# Patient Record
Sex: Female | Born: 1955 | Race: White | Hispanic: No | Marital: Married | Smoking: Current every day smoker
Health system: Southern US, Community
[De-identification: ages and names within clinical notes are randomized; demographics above are authoritative.]

## PROBLEM LIST (undated history)

## (undated) DIAGNOSIS — F32A Depression, unspecified: Secondary | ICD-10-CM

## (undated) DIAGNOSIS — F329 Major depressive disorder, single episode, unspecified: Secondary | ICD-10-CM

## (undated) DIAGNOSIS — K219 Gastro-esophageal reflux disease without esophagitis: Secondary | ICD-10-CM

## (undated) DIAGNOSIS — K5900 Constipation, unspecified: Secondary | ICD-10-CM

## (undated) DIAGNOSIS — M109 Gout, unspecified: Secondary | ICD-10-CM

## (undated) DIAGNOSIS — M1990 Unspecified osteoarthritis, unspecified site: Secondary | ICD-10-CM

## (undated) DIAGNOSIS — J45909 Unspecified asthma, uncomplicated: Secondary | ICD-10-CM

## (undated) DIAGNOSIS — I10 Essential (primary) hypertension: Secondary | ICD-10-CM

## (undated) HISTORY — DX: Constipation, unspecified: K59.00

## (undated) HISTORY — DX: Essential (primary) hypertension: I10

## (undated) HISTORY — DX: Major depressive disorder, single episode, unspecified: F32.9

## (undated) HISTORY — DX: Depression, unspecified: F32.A

## (undated) HISTORY — DX: Gastro-esophageal reflux disease without esophagitis: K21.9

## (undated) HISTORY — DX: Unspecified osteoarthritis, unspecified site: M19.90

## (undated) HISTORY — DX: Unspecified asthma, uncomplicated: J45.909

## (undated) HISTORY — DX: Gout, unspecified: M10.9

---

## 2005-02-12 HISTORY — PX: ABDOMINAL HYSTERECTOMY: SHX81

## 2010-02-12 HISTORY — PX: FOOT SURGERY: SHX648

## 2012-07-14 ENCOUNTER — Ambulatory Visit: Payer: Self-pay | Admitting: Family Medicine

## 2012-09-20 ENCOUNTER — Emergency Department: Payer: Self-pay | Admitting: Emergency Medicine

## 2012-09-20 LAB — CBC
HGB: 13.8 g/dL (ref 12.0–16.0)
MCHC: 34.5 g/dL (ref 32.0–36.0)
MCV: 91 fL (ref 80–100)
Platelet: 362 10*3/uL (ref 150–440)
RBC: 4.39 10*6/uL (ref 3.80–5.20)
RDW: 13.4 % (ref 11.5–14.5)
WBC: 9.7 10*3/uL (ref 3.6–11.0)

## 2012-09-20 LAB — TROPONIN I: Troponin-I: <0.02 ng/mL

## 2013-01-16 ENCOUNTER — Ambulatory Visit: Payer: Self-pay | Admitting: Family Medicine

## 2013-05-15 ENCOUNTER — Emergency Department: Payer: Self-pay | Admitting: Emergency Medicine

## 2013-05-15 LAB — COMPREHENSIVE METABOLIC PANEL
ANION GAP: 5 — AB (ref 7–16)
AST: 13 U/L — AB (ref 15–37)
Albumin: 3.7 g/dL (ref 3.4–5.0)
Alkaline Phosphatase: 102 U/L
BUN: 15 mg/dL (ref 7–18)
Bilirubin,Total: 0.4 mg/dL (ref 0.2–1.0)
CO2: 30 mmol/L (ref 21–32)
Calcium, Total: 8.6 mg/dL (ref 8.5–10.1)
Chloride: 102 mmol/L (ref 98–107)
Creatinine: 1.21 mg/dL (ref 0.60–1.30)
EGFR (African American): 58 — ABNORMAL LOW
GFR CALC NON AF AMER: 50 — AB
GLUCOSE: 99 mg/dL (ref 65–99)
OSMOLALITY: 275 (ref 275–301)
Potassium: 3.7 mmol/L (ref 3.5–5.1)
SGPT (ALT): 27 U/L (ref 12–78)
SODIUM: 137 mmol/L (ref 136–145)
Total Protein: 7 g/dL (ref 6.4–8.2)

## 2013-05-15 LAB — CBC WITH DIFFERENTIAL/PLATELET
BASOS PCT: 0.9 %
Basophil #: 0.1 10*3/uL (ref 0.0–0.1)
EOS PCT: 1.8 %
Eosinophil #: 0.2 10*3/uL (ref 0.0–0.7)
HCT: 44.7 % (ref 35.0–47.0)
HGB: 14.6 g/dL (ref 12.0–16.0)
Lymphocyte #: 3.7 10*3/uL — ABNORMAL HIGH (ref 1.0–3.6)
Lymphocyte %: 31.4 %
MCH: 30.2 pg (ref 26.0–34.0)
MCHC: 32.7 g/dL (ref 32.0–36.0)
MCV: 92 fL (ref 80–100)
MONO ABS: 0.6 x10 3/mm (ref 0.2–0.9)
Monocyte %: 5.3 %
NEUTROS ABS: 7.1 10*3/uL — AB (ref 1.4–6.5)
Neutrophil %: 60.6 %
Platelet: 413 10*3/uL (ref 150–440)
RBC: 4.85 10*6/uL (ref 3.80–5.20)
RDW: 13.4 % (ref 11.5–14.5)
WBC: 11.7 10*3/uL — ABNORMAL HIGH (ref 3.6–11.0)

## 2013-05-15 LAB — URINALYSIS, COMPLETE
Bacteria: NONE SEEN
Bilirubin,UR: NEGATIVE
Blood: NEGATIVE
GLUCOSE, UR: NEGATIVE mg/dL (ref 0–75)
Ketone: NEGATIVE
Leukocyte Esterase: NEGATIVE
NITRITE: NEGATIVE
Ph: 6 (ref 4.5–8.0)
Protein: NEGATIVE
RBC,UR: 1 /HPF (ref 0–5)
SPECIFIC GRAVITY: 1.057 (ref 1.003–1.030)

## 2013-05-15 LAB — LIPASE, BLOOD: Lipase: 155 U/L (ref 73–393)

## 2013-05-15 LAB — TROPONIN I

## 2014-02-26 ENCOUNTER — Ambulatory Visit: Payer: Self-pay | Admitting: Gastroenterology

## 2014-04-13 ENCOUNTER — Ambulatory Visit: Payer: Self-pay | Admitting: Gastroenterology

## 2014-06-07 LAB — SURGICAL PATHOLOGY

## 2014-07-27 ENCOUNTER — Ambulatory Visit (INDEPENDENT_AMBULATORY_CARE_PROVIDER_SITE_OTHER): Payer: No Typology Code available for payment source | Admitting: Family Medicine

## 2014-07-27 ENCOUNTER — Encounter: Payer: Self-pay | Admitting: Family Medicine

## 2014-07-27 VITALS — BP 110/82 | HR 72 | Temp 98.0°F | Resp 16 | Wt 165.0 lb

## 2014-07-27 DIAGNOSIS — T148 Other injury of unspecified body region: Secondary | ICD-10-CM

## 2014-07-27 DIAGNOSIS — J45909 Unspecified asthma, uncomplicated: Secondary | ICD-10-CM | POA: Insufficient documentation

## 2014-07-27 DIAGNOSIS — I10 Essential (primary) hypertension: Secondary | ICD-10-CM | POA: Insufficient documentation

## 2014-07-27 DIAGNOSIS — J00 Acute nasopharyngitis [common cold]: Secondary | ICD-10-CM | POA: Insufficient documentation

## 2014-07-27 DIAGNOSIS — F419 Anxiety disorder, unspecified: Secondary | ICD-10-CM | POA: Insufficient documentation

## 2014-07-27 DIAGNOSIS — W57XXXA Bitten or stung by nonvenomous insect and other nonvenomous arthropods, initial encounter: Secondary | ICD-10-CM

## 2014-07-27 DIAGNOSIS — E559 Vitamin D deficiency, unspecified: Secondary | ICD-10-CM | POA: Insufficient documentation

## 2014-07-27 DIAGNOSIS — M25559 Pain in unspecified hip: Secondary | ICD-10-CM | POA: Insufficient documentation

## 2014-07-27 DIAGNOSIS — J019 Acute sinusitis, unspecified: Secondary | ICD-10-CM

## 2014-07-27 DIAGNOSIS — K219 Gastro-esophageal reflux disease without esophagitis: Secondary | ICD-10-CM | POA: Insufficient documentation

## 2014-07-27 DIAGNOSIS — Z1239 Encounter for other screening for malignant neoplasm of breast: Secondary | ICD-10-CM | POA: Diagnosis not present

## 2014-07-27 DIAGNOSIS — M109 Gout, unspecified: Secondary | ICD-10-CM | POA: Insufficient documentation

## 2014-07-27 DIAGNOSIS — Z8709 Personal history of other diseases of the respiratory system: Secondary | ICD-10-CM | POA: Insufficient documentation

## 2014-07-27 DIAGNOSIS — Z8719 Personal history of other diseases of the digestive system: Secondary | ICD-10-CM | POA: Insufficient documentation

## 2014-07-27 MED ORDER — DOXYCYCLINE HYCLATE 100 MG PO TABS: 100.0000 mg | ORAL_TABLET | Freq: Two times a day (BID) | ORAL | Status: DC

## 2014-07-27 MED ORDER — FLUTICASONE PROPIONATE 50 MCG/ACT NA SUSP: 2.0000 | Freq: Every day | NASAL | Status: AC

## 2014-07-27 NOTE — Patient Instructions (Signed)
Discussed use of hydrocortisone cream 2-3 x day to insect bite

## 2014-07-27 NOTE — Progress Notes (Signed)
Subjective:     Patient ID: Katie Werner, female   DOB: 11/29/1955, 59 y.o.   MRN: [id number]  HPI  Chief Complaint  Patient presents with  . Sinus Problem    Patient comes in office today with concerns about sinus pain and pressure above her eyes. Patient reports swelling of right lymph node and pain in both ear but mostly in the right.  . Skin Problem    patient is present for skin check, she states that she noticed a bump on the left side of chest that is red itchy.   Describes intermittent clear to purulent sinus drainage. Bump on left upper chest area has been present for four days. States she does spend time outside.   Review of Systems  HENT: Positive for ear pain (in association with sinus pain).        Objective:   Physical Exam  Constitutional: She appears well-developed and well-nourished.  Ears: T.M's intact without inflammation Sinuses: mild tenderness in frontal and maxillary sinuses. Throat: no tonsillar enlargement or exudate Neck: right anterior cervical node is noted Lungs: clear Skin: erythematous papule with no underlying induration, crusting or drainage to left upper chest.     Assessment:     1. Acute sinusitis, recurrence not specified, unspecified location  - doxycycline (VIBRA-TABS) 100 MG tablet; Take 1 tablet (100 mg total) by mouth 2 (two) times daily.  Dispense: 20 tablet; Refill: 0 - fluticasone (FLONASE) 50 MCG/ACT nasal spray; Place 2 sprays into both nostrils daily.  Dispense: 16 g; Refill: 6  2. Insect bite   3. Breast cancer screening  - MM Digital Screening; Future    Plan:   hydrocortisone to insect bite.

## 2014-09-24 ENCOUNTER — Ambulatory Visit (INDEPENDENT_AMBULATORY_CARE_PROVIDER_SITE_OTHER): Payer: No Typology Code available for payment source | Admitting: Family Medicine

## 2014-09-24 ENCOUNTER — Other Ambulatory Visit: Payer: Self-pay

## 2014-09-24 ENCOUNTER — Encounter: Payer: Self-pay | Admitting: Family Medicine

## 2014-09-24 VITALS — BP 122/86 | HR 109 | Temp 98.1°F | Resp 16 | Wt 167.4 lb

## 2014-09-24 DIAGNOSIS — J988 Other specified respiratory disorders: Secondary | ICD-10-CM | POA: Diagnosis not present

## 2014-09-24 DIAGNOSIS — J069 Acute upper respiratory infection, unspecified: Secondary | ICD-10-CM | POA: Diagnosis not present

## 2014-09-24 MED ORDER — ALBUTEROL SULFATE HFA 108 (90 BASE) MCG/ACT IN AERS: INHALATION_SPRAY | RESPIRATORY_TRACT | Status: DC

## 2014-09-24 MED ORDER — HYDROCODONE-HOMATROPINE 5-1.5 MG/5ML PO SYRP
ORAL_SOLUTION | ORAL | Status: DC
Start: 2014-09-24 — End: 2014-10-01

## 2014-09-24 NOTE — Patient Instructions (Signed)
Add Mucinex D and use cough syrup as needed. Use inhaler at least twice daily.

## 2014-09-24 NOTE — Progress Notes (Signed)
Subjective:     Patient ID: Katie Werner, female   DOB: [birth year], 59 y.o.   MRN: [id number]  HPI  Chief Complaint  Patient presents with  . Sore Throat    dPatient comes in office today with concerns of sore throat, congestion and ear ache for the past several days. Patient would also like to address enlarged lymph node on the right side  States her symptoms started two days ago. Reports clear sinus congestion. Has been using Tylenol and Dayquil. Not using Ventolin inhaler at this time. Reports rare smoking.   Review of Systems  Constitutional: Negative for fever and chills.       Objective:   Physical Exam  Constitutional: She appears well-developed and well-nourished. No distress.  Pulmonary/Chest: She has wheezes (inspiratory and expiratory bilateral posterior lung fields.).  Ears: T.M's obscured by cerumen Throat: no tonsillar enlargement or exudate Neck: right anterior cervical node.      Assessment:    1. Upper respiratory infection - HYDROcodone-homatropine (HYCODAN) 5-1.5 MG/5ML syrup; 5 ml 4-6 hours as needed for cough  Dispense: 240 mL; Refill: 0  2. Wheezing-associated respiratory infection - albuterol (PROVENTIL HFA;VENTOLIN HFA) 108 (90 BASE) MCG/ACT inhaler; Two puffs every 4-6 hours for wheezing or shortness of breath  Dispense: 1 Inhaler; Refill: 1    Plan:    Discussed use of Mucinex D

## 2014-09-28 ENCOUNTER — Ambulatory Visit (INDEPENDENT_AMBULATORY_CARE_PROVIDER_SITE_OTHER): Payer: No Typology Code available for payment source | Admitting: Family Medicine

## 2014-09-28 ENCOUNTER — Encounter: Payer: Self-pay | Admitting: Family Medicine

## 2014-09-28 VITALS — BP 110/78 | HR 88 | Temp 97.8°F | Resp 18 | Wt 166.0 lb

## 2014-09-28 DIAGNOSIS — J40 Bronchitis, not specified as acute or chronic: Secondary | ICD-10-CM

## 2014-09-28 DIAGNOSIS — R062 Wheezing: Secondary | ICD-10-CM | POA: Diagnosis not present

## 2014-09-28 MED ORDER — PREDNISONE 20 MG PO TABS: ORAL_TABLET | ORAL | Status: DC

## 2014-09-28 MED ORDER — AZITHROMYCIN 250 MG PO TABS: ORAL_TABLET | ORAL | Status: DC

## 2014-09-28 NOTE — Progress Notes (Signed)
Subjective:     Patient ID: Katie Werner, female   DOB: 03-Oct-1955, 59 y.o.   MRN: [id number]  HPI  Chief Complaint  Patient presents with  . URI    Patient is present in office today for follow up on 09/24/14. Patient reports that symptoms have got worse and she is having tightness in chest and wheezing. Patient reports that she has productive cough and has a hard time catching her breath. Patient reports that she is still taking medication that was prescribed at last office visit but it is not helping.   States she is using albuterol MDI several x day and cough syrup with minimal improvement. Reports continued clear sinus drainage with PND.   Review of Systems  Constitutional: Negative for fever and chills.       Objective:   Physical Exam  Constitutional: She appears well-developed and well-nourished. She has a sickly appearance. No distress.  Ears: T.M's obscured by cerumen Throat: no tonsillar enlargement or exudate Neck: no cervical adenopathy Lungs: inspiratory and expiratory wheezes bilateral posterior lung fields.     Assessment:    1. Wheezing - predniSONE (DELTASONE) 20 MG tablet; Taper as follows: 3 pills for 4 days, two pills for 4 days, one pill for four days  Dispense: 24 tablet; Refill: 0  2. Bronchitis - predniSONE (DELTASONE) 20 MG tablet; Taper as follows: 3 pills for 4 days, two pills for 4 days, one pill for four days  Dispense: 24 tablet; Refill: 0 - azithromycin (ZITHROMAX) 250 MG tablet; 2 pills the first day then one pill daily  Dispense: 6 tablet; Refill: 0    Plan:    Continue albuterol MDI.

## 2014-09-28 NOTE — Patient Instructions (Signed)
Continue to use albuterol every 4-6 hours as needed for shortness of breath or wheezing.

## 2014-10-01 ENCOUNTER — Telehealth: Payer: Self-pay

## 2014-10-01 ENCOUNTER — Other Ambulatory Visit: Payer: Self-pay | Admitting: Family Medicine

## 2014-10-01 DIAGNOSIS — J069 Acute upper respiratory infection, unspecified: Secondary | ICD-10-CM

## 2014-10-01 MED ORDER — HYDROCODONE-HOMATROPINE 5-1.5 MG/5ML PO SYRP: ORAL_SOLUTION | ORAL | Status: DC

## 2014-10-01 NOTE — Telephone Encounter (Signed)
Patient has been advised she states she will have her husband pick up prescription, I informed patient that if symptoms continue to get worse she is strongly urged to seek treatment at ER. Patient understood. KW

## 2014-10-01 NOTE — Telephone Encounter (Signed)
Spoke with patient and her husband and they both state that patient symptoms since last visit have got worse, patient is almost out of Hydrocodone-Homatropine and is requesting that you refill Rx. Patients husband request that you also seen in Advair, he states that Dr. Sullivan Lone had patient on inhaler before and it helped her a lot. Please advise, pharmacy [facility] [address]. [place]

## 2014-10-01 NOTE — Telephone Encounter (Signed)
If she is taking the oral prednisone, antibiotic, and using the albuterol every 4-6 hours and is still short of breath she may need ER visit. Also take into consideration her emotional distress over her mother and this may be contributing to her symptoms as well. A refill on cough syrup will be up front for pickup. Advair is a maintenance medication not for acute wheezing.

## 2014-11-11 ENCOUNTER — Ambulatory Visit (INDEPENDENT_AMBULATORY_CARE_PROVIDER_SITE_OTHER): Payer: No Typology Code available for payment source

## 2014-11-11 DIAGNOSIS — Z23 Encounter for immunization: Secondary | ICD-10-CM | POA: Diagnosis not present

## 2014-11-15 ENCOUNTER — Other Ambulatory Visit: Payer: Self-pay

## 2014-11-15 ENCOUNTER — Telehealth: Payer: Self-pay | Admitting: Family Medicine

## 2014-11-15 MED ORDER — ZOLPIDEM TARTRATE 10 MG PO TABS: 10.0000 mg | ORAL_TABLET | Freq: Every evening | ORAL | Status: DC | PRN

## 2014-11-15 MED ORDER — ALPRAZOLAM 0.5 MG PO TBDP: 0.5000 mg | ORAL_TABLET | Freq: Every evening | ORAL | Status: DC | PRN

## 2014-11-15 MED ORDER — ALPRAZOLAM 0.5 MG PO TABS: 0.5000 mg | ORAL_TABLET | Freq: Every evening | ORAL | Status: DC | PRN

## 2014-11-15 NOTE — Telephone Encounter (Signed)
Try zolpidem 10 mg daily at bedtime when necessary #30 one refill/thanks

## 2014-11-15 NOTE — Telephone Encounter (Signed)
Pt's mother passed and pt is having trouble sleeping would like something to help her.   [facility] [address] CB# [phone] CC

## 2014-11-15 NOTE — Telephone Encounter (Signed)
Left message on v oicemail letting them know RX is ready for pick up up front-aa

## 2014-11-15 NOTE — Telephone Encounter (Signed)
See below please-aa 

## 2014-11-17 ENCOUNTER — Telehealth: Payer: Self-pay | Admitting: Family Medicine

## 2014-11-17 NOTE — Telephone Encounter (Signed)
Pt's husband called stating pt's mother passed away and she is having a hard time.  They contacted insurance and do not need a referral for Dr. Sullivan Lone.  He would like to know who you would recommend for psychiatry.

## 2014-11-17 NOTE — Telephone Encounter (Signed)
Do you know of the name of the place by chance?-aa

## 2014-11-17 NOTE — Telephone Encounter (Signed)
[facility] does.

## 2014-11-17 NOTE — Telephone Encounter (Signed)
Maralyn Sago could you let the patient's husband know of the place thank you-aa

## 2014-11-17 NOTE — Telephone Encounter (Signed)
Please advise. Thanks.  

## 2014-11-17 NOTE — Telephone Encounter (Signed)
[facility] in town. That is the only way she can get in before the end of the year.

## 2015-01-25 ENCOUNTER — Encounter: Payer: Self-pay | Admitting: Family Medicine

## 2015-01-25 ENCOUNTER — Ambulatory Visit (INDEPENDENT_AMBULATORY_CARE_PROVIDER_SITE_OTHER): Payer: No Typology Code available for payment source | Admitting: Family Medicine

## 2015-01-25 VITALS — BP 118/86 | HR 72 | Temp 97.9°F | Resp 16 | Ht 61.25 in | Wt 163.0 lb

## 2015-01-25 DIAGNOSIS — K5909 Other constipation: Secondary | ICD-10-CM

## 2015-01-25 DIAGNOSIS — Z1231 Encounter for screening mammogram for malignant neoplasm of breast: Secondary | ICD-10-CM | POA: Diagnosis not present

## 2015-01-25 DIAGNOSIS — K118 Other diseases of salivary glands: Secondary | ICD-10-CM | POA: Diagnosis not present

## 2015-01-25 DIAGNOSIS — Z124 Encounter for screening for malignant neoplasm of cervix: Secondary | ICD-10-CM | POA: Diagnosis not present

## 2015-01-25 DIAGNOSIS — Z0000 Encounter for general adult medical examination without abnormal findings: Secondary | ICD-10-CM | POA: Diagnosis not present

## 2015-01-25 DIAGNOSIS — R319 Hematuria, unspecified: Secondary | ICD-10-CM | POA: Diagnosis not present

## 2015-01-25 DIAGNOSIS — G4700 Insomnia, unspecified: Secondary | ICD-10-CM

## 2015-01-25 LAB — POCT URINALYSIS DIPSTICK
Bilirubin, UA: NEGATIVE
GLUCOSE UA: NEGATIVE
Ketones, UA: NEGATIVE
Leukocytes, UA: NEGATIVE
Nitrite, UA: NEGATIVE
Protein, UA: NEGATIVE
SPEC GRAV UA: 1.015
UROBILINOGEN UA: 0.2
pH, UA: 6

## 2015-01-25 LAB — POCT UA - MICROSCOPIC ONLY
BACTERIA, U MICROSCOPIC: NEGATIVE
CASTS, UR, LPF, POC: NEGATIVE
CRYSTALS, UR, HPF, POC: NEGATIVE
Epithelial cells, urine per micros: NEGATIVE
MUCUS UA: NEGATIVE
RBC, urine, microscopic: NEGATIVE
Yeast, UA: NEGATIVE

## 2015-01-25 NOTE — Progress Notes (Signed)
Patient ID: Katie Werner, female   DOB: 09/06/1955, 59 y.o.   MRN: [id number]  Visit Date: 01/25/2015  Today's Provider: Megan Mansichard  Jr, MD   Chief Complaint  Patient presents with  . Annual Exam   Subjective:  Katie OhmLisa T Baity is a 59 y.o. female who presents today for health maintenance and complete physical. She feels fairly well. She reports exercising none. She reports she is sleeping fairly well.  Colonoscopy 03/01/14 sessile serrated adenoma, tubular adenoma Sigmoidoscopy 04/13/14 hyperplastic polyps repeat 2 years Pneumovax 01/16/11 Tdap 01/15/13  Review of Systems  Constitutional: Positive for fatigue.  HENT: Positive for ear pain.   Eyes: Positive for redness.  Respiratory: Negative.   Cardiovascular: Negative.   Gastrointestinal: Positive for abdominal pain, diarrhea, constipation and abdominal distention.  Endocrine: Negative.   Genitourinary: Positive for frequency.  Musculoskeletal: Positive for neck pain.  Skin: Negative.   Allergic/Immunologic: Negative.   Neurological: Negative.   Hematological: Positive for adenopathy.  Psychiatric/Behavioral: Negative.     Social History   Social History  . Marital Status: Married    Spouse Name: N/A  . Number of Children: N/A  . Years of Education: N/A   Occupational History  . Not on file.   Social History Main Topics  . Smoking status: Never Smoker   . Smokeless tobacco: Never Used  . Alcohol Use: 0.0 oz/week    0 Standard drinks or equivalent per week  . Drug Use: No  . Sexual Activity: Not Currently   Other Topics Concern  . Not on file   Social History Narrative    Patient Active Problem List   Diagnosis Date Noted  . Anxiety 07/27/2014  . 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 Acute coryza 07/27/2014  . AB (asthmatic bronchitis) 07/27/2014  . Gout 07/27/2014  . Acid reflux 07/27/2014  . Arthralgia of hip 07/27/2014  . History of asthma 07/27/2014  . History of digestive disease 07/27/2014  .  BP (high blood pressure) 07/27/2014  . Avitaminosis D 07/27/2014    Past Surgical History  Procedure Laterality Date  . Foot surgery  2012    had callus removed near her 3rd toe and cushion placed  . Abdominal hysterectomy  2007    Her family history is not on file.    Outpatient Prescriptions Prior to Visit  Medication Sig Dispense Refill  . albuterol (PROVENTIL HFA;VENTOLIN HFA) 108 (90 BASE) MCG/ACT inhaler Two puffs every 4-6 hours for wheezing or shortness of breath 1 Inhaler 1  . ALPRAZolam (XANAX) 0.5 MG tablet Take 1 tablet (0.5 mg total) by mouth at bedtime as needed for anxiety. 30 tablet 2  . azithromycin (ZITHROMAX) 250 MG tablet 2 pills the first day then one pill daily 6 tablet 0  . CITRUCEL oral powder   1  . fluticasone (FLONASE) 50 MCG/ACT nasal spray Place 2 sprays into both nostrils daily. 16 g 6  . HYDROcodone-homatropine (HYCODAN) 5-1.5 MG/5ML syrup 5 ml 4-6 hours as needed for cough 240 mL 0  . losartan (COZAAR) 50 MG tablet   0  . predniSONE (DELTASONE) 20 MG tablet Taper as follows: 3 pills for 4 days, two pills for 4 days, one pill for four days 24 tablet 0  . venlafaxine XR (EFFEXOR XR) 150 MG 24 hr capsule Take by mouth.    . venlafaxine XR (EFFEXOR-XR) 75 MG 24 hr capsule   1   No facility-administered medications prior to visit.    Patient Care Team: Maple Hudsonichard L  Jr.,  MD as PCP - General (Family Medicine)     Objective:   Vitals:  Filed Vitals:   01/25/15 1010  BP: 118/86  Pulse: 72  Temp: 97.9 F (36.6 C)  Resp: 16  Height: 5' 1.25" (1.556 m)  Weight: 163 lb (73.936 kg)    Physical Exam  Constitutional: She is oriented to person, place, and time. She appears well-developed and well-nourished.  HENT:  Head: Normocephalic and atraumatic.  Right Ear: External ear normal.  Left Ear: External ear normal.  Nose: Nose normal.  Mouth/Throat: Oropharynx is clear and moist.  Eyes: Conjunctivae and EOM are normal. Pupils are equal, round,  and reactive to light.  Neck: Neck supple. No JVD present. No thyromegaly present.  Cardiovascular: Normal rate, regular rhythm and normal heart sounds.   Pulmonary/Chest: Effort normal and breath sounds normal.  Abdominal: Soft.  Lymphadenopathy:    She has no cervical adenopathy.  Neurological: She is alert and oriented to person, place, and time.  Skin: Skin is warm and dry.  Psychiatric: She has a normal mood and affect. Her behavior is normal. Judgment and thought content normal.     Depression Screen No flowsheet data found.    Assessment & Plan:   1. Annual physical exam - CBC with Differential/Platelet - Comprehensive metabolic panel - Lipid Panel With LDL/HDL Ratio - POCT Urinalysis Dipstick - TSH  2. Other constipation Advised to stop all of the supplements and OTC stool softeners she is using for this right now and try Linzess 145 mcg. Pt has about 1 BM per week. 3. Insomnia Will address on the follow up visit. 4.s/p Hysterectomy 4. Cervical cancer screening Due to only having ovaries advised patient she will need another pap in 5 years and possibly not again. - Pap IG and HPV (high risk) DNA detection  5. Encounter for screening mammogram for breast cancer - MM Digital Screening; Future  6. Hematuria - POCT UA - Microscopic Only  I have done the exam and reviewed the above chart and it is accurate to the best of my knowledge.  Patient was seen and examined by Dr. Bosie Clos and note was scribed by Samara Deist, RMA.

## 2015-01-26 ENCOUNTER — Ambulatory Visit: Payer: Self-pay

## 2015-01-26 ENCOUNTER — Telehealth: Payer: Self-pay

## 2015-01-26 LAB — COMPREHENSIVE METABOLIC PANEL
ALBUMIN: 4.4 g/dL (ref 3.5–5.5)
ALT: 9 IU/L (ref 0–32)
AST: 15 IU/L (ref 0–40)
Albumin/Globulin Ratio: 1.9 (ref 1.1–2.5)
Alkaline Phosphatase: 91 IU/L (ref 39–117)
BUN / CREAT RATIO: 7 — AB (ref 9–23)
BUN: 7 mg/dL (ref 6–24)
Bilirubin Total: 0.4 mg/dL (ref 0.0–1.2)
CALCIUM: 9.4 mg/dL (ref 8.7–10.2)
CO2: 27 mmol/L (ref 18–29)
CREATININE: 0.94 mg/dL (ref 0.57–1.00)
Chloride: 95 mmol/L — ABNORMAL LOW (ref 96–106)
GFR, EST AFRICAN AMERICAN: 78 mL/min/{1.73_m2} (ref >59)
GFR, EST NON AFRICAN AMERICAN: 68 mL/min/{1.73_m2} (ref >59)
GLUCOSE: 98 mg/dL (ref 65–99)
Globulin, Total: 2.3 g/dL (ref 1.5–4.5)
Potassium: 4.2 mmol/L (ref 3.5–5.2)
Sodium: 142 mmol/L (ref 134–144)
TOTAL PROTEIN: 6.7 g/dL (ref 6.0–8.5)

## 2015-01-26 LAB — CBC WITH DIFFERENTIAL/PLATELET
BASOS: 1 %
Basophils Absolute: 0 10*3/uL (ref 0.0–0.2)
EOS (ABSOLUTE): 0.2 10*3/uL (ref 0.0–0.4)
Eos: 2 %
Hematocrit: 42 % (ref 34.0–46.6)
Hemoglobin: 14.2 g/dL (ref 11.1–15.9)
IMMATURE GRANS (ABS): 0 10*3/uL (ref 0.0–0.1)
Immature Granulocytes: 0 %
LYMPHS: 39 %
Lymphocytes Absolute: 2.7 10*3/uL (ref 0.7–3.1)
MCH: 30.2 pg (ref 26.6–33.0)
MCHC: 33.8 g/dL (ref 31.5–35.7)
MCV: 89 fL (ref 79–97)
MONOS ABS: 0.4 10*3/uL (ref 0.1–0.9)
Monocytes: 6 %
NEUTROS ABS: 3.7 10*3/uL (ref 1.4–7.0)
Neutrophils: 52 %
Platelets: 427 10*3/uL — ABNORMAL HIGH (ref 150–379)
RBC: 4.7 x10E6/uL (ref 3.77–5.28)
RDW: 13.9 % (ref 12.3–15.4)
WBC: 7 10*3/uL (ref 3.4–10.8)

## 2015-01-26 LAB — LIPID PANEL WITH LDL/HDL RATIO
Cholesterol, Total: 234 mg/dL — ABNORMAL HIGH (ref 100–199)
HDL: 50 mg/dL (ref >39)
LDL CALC: 157 mg/dL — AB (ref 0–99)
LDl/HDL Ratio: 3.1 ratio units (ref 0.0–3.2)
Triglycerides: 133 mg/dL (ref 0–149)
VLDL CHOLESTEROL CAL: 27 mg/dL (ref 5–40)

## 2015-01-26 LAB — TSH: TSH: 3.64 u[IU]/mL (ref 0.450–4.500)

## 2015-01-26 NOTE — Telephone Encounter (Signed)
Patient advised as directed below. 

## 2015-01-26 NOTE — Telephone Encounter (Signed)
-----   Message from Maple Hudsonichard L Gilbert Jr., MD sent at 01/26/2015  2:31 PM EST ----- Labs stable

## 2015-01-26 NOTE — Telephone Encounter (Signed)
LMTCB

## 2015-01-28 ENCOUNTER — Ambulatory Visit
Admission: RE | Admit: 2015-01-28 | Discharge: 2015-01-28 | Disposition: A | Discharge status: Home / Self Care | Payer: No Typology Code available for payment source | Source: Ambulatory Visit | Attending: Family Medicine | Admitting: Family Medicine

## 2015-01-28 ENCOUNTER — Other Ambulatory Visit: Payer: Self-pay | Admitting: Emergency Medicine

## 2015-01-28 ENCOUNTER — Other Ambulatory Visit: Payer: Self-pay | Admitting: Family Medicine

## 2015-01-28 DIAGNOSIS — R928 Other abnormal and inconclusive findings on diagnostic imaging of breast: Secondary | ICD-10-CM

## 2015-01-28 DIAGNOSIS — Z1231 Encounter for screening mammogram for malignant neoplasm of breast: Secondary | ICD-10-CM

## 2015-02-01 ENCOUNTER — Ambulatory Visit
Admission: RE | Admit: 2015-02-01 | Discharge: 2015-02-01 | Disposition: A | Discharge status: Home / Self Care | Payer: No Typology Code available for payment source | Source: Ambulatory Visit | Attending: Family Medicine | Admitting: Family Medicine

## 2015-02-01 ENCOUNTER — Other Ambulatory Visit: Payer: Self-pay | Admitting: Family Medicine

## 2015-02-01 DIAGNOSIS — R928 Other abnormal and inconclusive findings on diagnostic imaging of breast: Secondary | ICD-10-CM

## 2015-02-01 LAB — PAP IG AND HPV HIGH-RISK
HPV, HIGH-RISK: NEGATIVE
PAP Smear Comment: 0

## 2015-02-03 ENCOUNTER — Other Ambulatory Visit: Payer: Self-pay | Admitting: Otolaryngology

## 2015-02-03 DIAGNOSIS — M542 Cervicalgia: Secondary | ICD-10-CM

## 2015-02-09 ENCOUNTER — Ambulatory Visit
Admission: RE | Admit: 2015-02-09 | Discharge: 2015-02-09 | Disposition: A | Discharge status: Home / Self Care | Payer: No Typology Code available for payment source | Source: Ambulatory Visit | Attending: Otolaryngology | Admitting: Otolaryngology

## 2015-02-09 DIAGNOSIS — M542 Cervicalgia: Secondary | ICD-10-CM | POA: Insufficient documentation

## 2015-02-09 MED ORDER — IOHEXOL 300 MG/ML  SOLN
75.0000 mL | Freq: Once | INTRAMUSCULAR | Status: AC | PRN
  Administered 2015-02-09: 75 mL via INTRAVENOUS

## 2015-02-18 ENCOUNTER — Other Ambulatory Visit: Payer: Self-pay | Admitting: Family Medicine

## 2015-02-18 ENCOUNTER — Other Ambulatory Visit: Payer: Self-pay

## 2015-02-18 MED ORDER — ALPRAZOLAM 0.5 MG PO TABS: 0.5000 mg | ORAL_TABLET | Freq: Every evening | ORAL | Status: DC | PRN

## 2015-02-18 NOTE — Telephone Encounter (Signed)
Pt contacted office for refill request on the following medications:  ALPRAZolam (XANAX) 0.5 MG tablet.  [facility] [address]  [phone]#[phone]/MW

## 2015-02-18 NOTE — Telephone Encounter (Signed)
Patient requesting Xanax refill. Please review-aa Last filled was October 3rd with 2 refills.

## 2015-02-24 ENCOUNTER — Encounter: Payer: Self-pay | Admitting: Family Medicine

## 2015-02-24 ENCOUNTER — Ambulatory Visit (INDEPENDENT_AMBULATORY_CARE_PROVIDER_SITE_OTHER): Payer: BLUE CROSS/BLUE SHIELD | Admitting: Family Medicine

## 2015-02-24 VITALS — BP 128/72 | HR 78 | Resp 18 | Wt 166.0 lb

## 2015-02-24 DIAGNOSIS — K118 Other diseases of salivary glands: Secondary | ICD-10-CM | POA: Diagnosis not present

## 2015-02-24 DIAGNOSIS — F419 Anxiety disorder, unspecified: Secondary | ICD-10-CM | POA: Diagnosis not present

## 2015-02-24 DIAGNOSIS — G4700 Insomnia, unspecified: Secondary | ICD-10-CM

## 2015-02-24 DIAGNOSIS — K5909 Other constipation: Secondary | ICD-10-CM

## 2015-02-24 MED ORDER — LINACLOTIDE 145 MCG PO CAPS: 145.0000 ug | ORAL_CAPSULE | Freq: Every day | ORAL | Status: DC

## 2015-02-24 MED ORDER — TRAZODONE HCL 100 MG PO TABS: 100.0000 mg | ORAL_TABLET | Freq: Every day | ORAL | Status: DC

## 2015-02-24 NOTE — Progress Notes (Signed)
Patient ID: Katie Werner, female   DOB: [birth year], 60 y.o.   MRN: [id number]    Subjective:  HPI  Patient is here for 1 month follow up.  Constipation: We provided samples of Linzess 145 mcg and that helped her bowel movements but she ran out of samples and her symptoms are worse again.  Insomnia: Patient is still having a hard time with this, falling asleep. We were going to address this during this visit.  Prior to Admission medications   Medication Sig Start Date End Date Taking? Authorizing Provider  albuterol (PROVENTIL HFA;VENTOLIN HFA) 108 (90 BASE) MCG/ACT inhaler Two puffs every 4-6 hours for wheezing or shortness of breath 09/24/14  Yes Anola Gurney, PA  ALPRAZolam Prudy Feeler) 0.5 MG tablet Take 1 tablet (0.5 mg total) by mouth at bedtime as needed for anxiety. 02/18/15  Yes Richard Hulen Shouts., MD  fluticasone [facility]) 50 MCG/ACT nasal spray Place 2 sprays into both nostrils daily. 07/27/14  Yes Anola Gurney, PA  Linaclotide (LINZESS) 145 MCG CAPS capsule Take 145 mcg by mouth daily.   Yes Historical Provider, MD  losartan (COZAAR) 50 MG tablet  07/26/14  Yes Historical Provider, MD  venlafaxine XR (EFFEXOR XR) 150 MG 24 hr capsule Take by mouth. 07/02/13  Yes Historical Provider, MD  venlafaxine XR (EFFEXOR-XR) 75 MG 24 hr capsule take 3 capsules by mouth once daily 02/18/15  Yes Richard Hulen Shouts., MD    Patient Active Problem List   Diagnosis Date Noted  . Anxiety 07/27/2014  . 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 Acute coryza 07/27/2014  . AB (asthmatic bronchitis) 07/27/2014  . Gout 07/27/2014  . Acid reflux 07/27/2014  . Arthralgia of hip 07/27/2014  . History of asthma 07/27/2014  . History of digestive disease 07/27/2014  . BP (high blood pressure) 07/27/2014  . Avitaminosis D 07/27/2014    Past Medical History  Diagnosis Date  . Depression   . Hypertension   . Gout   . Arthritis   . GERD (gastroesophageal reflux disease)   . Asthma   . Gout   .  Constipation     Social History   Social History  . Marital Status: Married    Spouse Name: N/A  . Number of Children: N/A  . Years of Education: N/A   Occupational History  . Not on file.   Social History Main Topics  . Smoking status: Current Some Day Smoker -- 0.25 packs/day for 35 years    Types: Cigarettes  . Smokeless tobacco: Never Used  . Alcohol Use: 0.0 oz/week    0 Standard drinks or equivalent per week  . Drug Use: No  . Sexual Activity: Not Currently   Other Topics Concern  . Not on file   Social History Narrative    Allergies  Allergen Reactions  . Penicillins     breaks out and has trouble breathing    Review of Systems  Constitutional: Negative.   Eyes: Negative.   Respiratory: Negative.   Cardiovascular: Negative.   Gastrointestinal: Positive for abdominal pain and constipation.  Skin: Negative.   Endo/Heme/Allergies: Negative.   Psychiatric/Behavioral: The patient has insomnia.     Immunization History  Administered Date(s) Administered  . Influenza,inj,Quad PF,36+ Mos 11/11/2014  . Pneumococcal Polysaccharide-23 01/16/2011  . Tdap 01/15/2013   Objective:  BP 128/72 mmHg  Pulse 78  Resp 18  Wt 166 lb (75.297 kg)  LMP  (LMP Unknown)  Physical Exam  Constitutional: She is oriented to person,  place, and time and well-developed, well-nourished, and in no distress.  HENT:  Head: Normocephalic and atraumatic.  Right Ear: External ear normal.  Left Ear: External ear normal.  Nose: Nose normal.  Eyes: Conjunctivae are normal. Pupils are equal, round, and reactive to light.  Cardiovascular: Normal rate, regular rhythm, normal heart sounds and intact distal pulses.   No murmur heard. Pulmonary/Chest: Effort normal and breath sounds normal. No respiratory distress. She has no wheezes.  Abdominal: Soft. Bowel sounds are normal. She exhibits no distension. There is no tenderness.  Musculoskeletal: Normal range of motion. She exhibits no  edema.  Neurological: She is alert and oriented to person, place, and time. Gait normal.  Skin: Skin is warm and dry.  Psychiatric: Mood, memory, affect and judgment normal.    Lab Results  Component Value Date   WBC 7.0 01/25/2015   HGB 14.6 05/15/2013   HCT 42.0 01/25/2015   PLT 427* 01/25/2015   GLUCOSE 98 01/25/2015   CHOL 234* 01/25/2015   TRIG 133 01/25/2015   HDL 50 01/25/2015   LDLCALC 157* 01/25/2015   TSH 3.640 01/25/2015    CMP     Component Value Date/Time   NA 142 01/25/2015 1105   NA 137 05/15/2013 0047   K 4.2 01/25/2015 1105   K 3.7 05/15/2013 0047   CL 95* 01/25/2015 1105   CL 102 05/15/2013 0047   CO2 27 01/25/2015 1105   CO2 30 05/15/2013 0047   GLUCOSE 98 01/25/2015 1105   GLUCOSE 99 05/15/2013 0047   BUN 7 01/25/2015 1105   BUN 15 05/15/2013 0047   CREATININE 0.94 01/25/2015 1105   CREATININE 1.21 05/15/2013 0047   CALCIUM 9.4 01/25/2015 1105   CALCIUM 8.6 05/15/2013 0047   PROT 6.7 01/25/2015 1105   PROT 7.0 05/15/2013 0047   ALBUMIN 4.4 01/25/2015 1105   ALBUMIN 3.7 05/15/2013 0047   AST 15 01/25/2015 1105   AST 13* 05/15/2013 0047   ALT 9 01/25/2015 1105   ALT 27 05/15/2013 0047   ALKPHOS 91 01/25/2015 1105   ALKPHOS 102 05/15/2013 0047   BILITOT 0.4 01/25/2015 1105   BILITOT 0.4 05/15/2013 0047   GFRNONAA 68 01/25/2015 1105   GFRNONAA 50* 05/15/2013 0047   GFRAA 78 01/25/2015 1105   GFRAA 58* 05/15/2013 0047    Assessment and Plan :  1. Other constipation Better on Linzess. Will provide RX for this. Follow. - Linaclotide (LINZESS) 145 MCG CAPS capsule; Take 1 capsule (145 mcg total) by mouth daily.  Dispense: 30 capsule; Refill: 5  2. Insomnia Will start Trazodone and re check on the next visit. - traZODone (DESYREL) 100 MG tablet; Take 1 tablet (100 mg total) by mouth at bedtime.  Dispense: 30 tablet; Refill: 5  3. Anxiety Chronic ongoing issue for this patient. Stable.   Julieanne Mansonichard Gilbert MD [facility] 02/24/2015 10:06 AM

## 2015-03-04 ENCOUNTER — Other Ambulatory Visit: Payer: Self-pay | Admitting: Otolaryngology

## 2015-03-04 DIAGNOSIS — R221 Localized swelling, mass and lump, neck: Secondary | ICD-10-CM

## 2015-03-14 ENCOUNTER — Ambulatory Visit (INDEPENDENT_AMBULATORY_CARE_PROVIDER_SITE_OTHER): Payer: BLUE CROSS/BLUE SHIELD | Admitting: Family Medicine

## 2015-03-14 ENCOUNTER — Encounter: Payer: Self-pay | Admitting: Family Medicine

## 2015-03-14 VITALS — BP 124/90 | HR 78 | Temp 97.8°F | Resp 16 | Wt 167.0 lb

## 2015-03-14 DIAGNOSIS — G3184 Mild cognitive impairment, so stated: Secondary | ICD-10-CM

## 2015-03-14 DIAGNOSIS — F419 Anxiety disorder, unspecified: Secondary | ICD-10-CM

## 2015-03-14 DIAGNOSIS — K219 Gastro-esophageal reflux disease without esophagitis: Secondary | ICD-10-CM | POA: Diagnosis not present

## 2015-03-14 DIAGNOSIS — G4489 Other headache syndrome: Secondary | ICD-10-CM

## 2015-03-14 DIAGNOSIS — G4700 Insomnia, unspecified: Secondary | ICD-10-CM

## 2015-03-14 NOTE — Progress Notes (Signed)
Patient ID: Katie Werner, female   DOB: [birth year], 60 y.o.   MRN: [id number]    Subjective:  HPI  Patient is here with her husband. Husband states that he has noticed that patient is having a hard time with her memory and been confused. Examples are; for a few months he noticed she has hard time finding words she is trying to say, also recently within a month had 2 episodes 1. Husband told patient where they were going for lunch and she did not know what '[employer] [place]" was-and husband states she knows what that is, 2. Recently her daughter called and was talking to the patient about her grandparents and family related issues with that and patient did not know who were the grandparents or their names-acted like they were strangers. She knows who they are now. Patient states its because of the headache she has had for a while and she can not think right.  Prior to Admission medications   Medication Sig Start Date End Date Taking? Authorizing Provider  albuterol (PROVENTIL HFA;VENTOLIN HFA) 108 (90 BASE) MCG/ACT inhaler Two puffs every 4-6 hours for wheezing or shortness of breath 09/24/14  Yes Anola Gurney, PA  ALPRAZolam Prudy Feeler) 0.5 MG tablet Take 1 tablet (0.5 mg total) by mouth at bedtime as needed for anxiety. 02/18/15  Yes Yanixan Mellinger Hulen Shouts., MD  azelastine (ASTELIN) 0.1 % nasal spray instill 2 sprays into each nostril every 12 hours if needed for ALLERGIES 03/04/15  Yes Historical Provider, MD  fluticasone (FLONASE) 50 MCG/ACT nasal spray Place 2 sprays into both nostrils daily. 07/27/14  Yes Anola Gurney, PA  Linaclotide [facility]) 145 MCG CAPS capsule Take 1 capsule (145 mcg total) by mouth daily. 02/24/15  Yes Aubreyana Saltz Hulen Shouts., MD  losartan (COZAAR) 50 MG tablet  07/26/14  Yes Historical Provider, MD  traZODone (DESYREL) 100 MG tablet Take 1 tablet (100 mg total) by mouth at bedtime. 02/24/15  Yes Raquon Milledge Hulen Shouts., MD  venlafaxine XR (EFFEXOR XR) 150 MG 24 hr capsule Take by mouth.  07/02/13  Yes Historical Provider, MD  venlafaxine XR (EFFEXOR-XR) 75 MG 24 hr capsule take 3 capsules by mouth once daily 02/18/15  Yes Analyce Tavares Hulen Shouts., MD    Patient Active Problem List   Diagnosis Date Noted  . Anxiety 07/27/2014  . 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 Acute coryza 07/27/2014  . AB (asthmatic bronchitis) 07/27/2014  . Gout 07/27/2014  . Acid reflux 07/27/2014  . Arthralgia of hip 07/27/2014  . History of asthma 07/27/2014  . History of digestive disease 07/27/2014  . BP (high blood pressure) 07/27/2014  . Avitaminosis D 07/27/2014    Past Medical History  Diagnosis Date  . Depression   . Hypertension   . Gout   . Arthritis   . GERD (gastroesophageal reflux disease)   . Asthma   . Gout   . Constipation     Social History   Social History  . Marital Status: Married    Spouse Name: N/A  . Number of Children: N/A  . Years of Education: N/A   Occupational History  . Not on file.   Social History Main Topics  . Smoking status: Current Some Day Smoker -- 0.25 packs/day for 35 years    Types: Cigarettes  . Smokeless tobacco: Never Used  . Alcohol Use: 0.0 oz/week    0 Standard drinks or equivalent per week  . Drug Use: No  . Sexual Activity: Not Currently  Other Topics Concern  . Not on file   Social History Narrative    Allergies  Allergen Reactions  . Penicillins     breaks out and has trouble breathing    Review of Systems  Constitutional: Positive for malaise/fatigue.  HENT: Positive for congestion.   Respiratory: Negative.   Cardiovascular: Negative.   Neurological: Positive for headaches.  Psychiatric/Behavioral: Positive for memory loss.    Immunization History  Administered Date(s) Administered  . Influenza,inj,Quad PF,36+ Mos 11/11/2014  . Pneumococcal Polysaccharide-23 01/16/2011  . Tdap 01/15/2013   Objective:  BP 124/90 mmHg  Pulse 78  Temp(Src) 97.8 F (36.6 C)  Resp 16  Wt 167 lb (75.751 kg)  LMP  (LMP  Unknown)  Physical Exam  Constitutional: She is oriented to person, place, and time and well-developed, well-nourished, and in no distress.  HENT:  Head: Normocephalic and atraumatic.  Right Ear: External ear normal.  Left Ear: External ear normal.  Eyes: Conjunctivae are normal. Pupils are equal, round, and reactive to light.  Neck: Normal range of motion. Neck supple.  Cardiovascular: Normal rate, regular rhythm, normal heart sounds and intact distal pulses.   No murmur heard. Pulmonary/Chest: Effort normal and breath sounds normal. No respiratory distress. She has no wheezes.  Musculoskeletal: Normal range of motion. She exhibits no edema or tenderness.  Neurological: She is alert and oriented to person, place, and time. She has normal reflexes and intact cranial nerves. She is not agitated. She displays no weakness, no tremor and normal speech. No cranial nerve deficit. Gait normal. Gait normal.  Psychiatric: Mood and affect normal.    Lab Results  Component Value Date   WBC 7.0 01/25/2015   HGB 14.6 05/15/2013   HCT 42.0 01/25/2015   PLT 427* 01/25/2015   GLUCOSE 98 01/25/2015   CHOL 234* 01/25/2015   TRIG 133 01/25/2015   HDL 50 01/25/2015   LDLCALC 157* 01/25/2015   TSH 3.640 01/25/2015    CMP     Component Value Date/Time   NA 142 01/25/2015 1105   NA 137 05/15/2013 0047   K 4.2 01/25/2015 1105   K 3.7 05/15/2013 0047   CL 95* 01/25/2015 1105   CL 102 05/15/2013 0047   CO2 27 01/25/2015 1105   CO2 30 05/15/2013 0047   GLUCOSE 98 01/25/2015 1105   GLUCOSE 99 05/15/2013 0047   BUN 7 01/25/2015 1105   BUN 15 05/15/2013 0047   CREATININE 0.94 01/25/2015 1105   CREATININE 1.21 05/15/2013 0047   CALCIUM 9.4 01/25/2015 1105   CALCIUM 8.6 05/15/2013 0047   PROT 6.7 01/25/2015 1105   PROT 7.0 05/15/2013 0047   ALBUMIN 4.4 01/25/2015 1105   ALBUMIN 3.7 05/15/2013 0047   AST 15 01/25/2015 1105   AST 13* 05/15/2013 0047   ALT 9 01/25/2015 1105   ALT 27 05/15/2013  0047   ALKPHOS 91 01/25/2015 1105   ALKPHOS 102 05/15/2013 0047   BILITOT 0.4 01/25/2015 1105   BILITOT 0.4 05/15/2013 0047   GFRNONAA 68 01/25/2015 1105   GFRNONAA 50* 05/15/2013 0047   GFRAA 78 01/25/2015 1105   GFRAA 58* 05/15/2013 0047    Assessment and Plan :  1. MCI (mild cognitive impairment) with memory loss New. MMSE 25/30. Husband and family members have noticed this issue. Will refer to neurologist. Some cognitive impairment is obvious/noticeable today 2. Anxiety Consider trying to cut down on Xanax.It is of note that her mother died of heart disease late Jul 05, 2014 3. Insomnia  4.  Headaches On going for at least 1 month. Refer to neurologist. 4.MDD Could be contributing to MCI. Julieanne Manson MD [facility] 03/14/2015 3:01 PM

## 2015-03-15 LAB — CBC WITH DIFFERENTIAL/PLATELET
BASOS: 0 %
Basophils Absolute: 0 10*3/uL (ref 0.0–0.2)
EOS (ABSOLUTE): 0.3 10*3/uL (ref 0.0–0.4)
EOS: 3 %
HEMATOCRIT: 39.6 % (ref 34.0–46.6)
Hemoglobin: 13.3 g/dL (ref 11.1–15.9)
IMMATURE GRANULOCYTES: 0 %
Immature Grans (Abs): 0 10*3/uL (ref 0.0–0.1)
Lymphocytes Absolute: 3.6 10*3/uL — ABNORMAL HIGH (ref 0.7–3.1)
Lymphs: 40 %
MCH: 30.2 pg (ref 26.6–33.0)
MCHC: 33.6 g/dL (ref 31.5–35.7)
MCV: 90 fL (ref 79–97)
MONOS ABS: 0.5 10*3/uL (ref 0.1–0.9)
Monocytes: 5 %
NEUTROS ABS: 4.5 10*3/uL (ref 1.4–7.0)
NEUTROS PCT: 52 %
PLATELETS: 409 10*3/uL — AB (ref 150–379)
RBC: 4.4 x10E6/uL (ref 3.77–5.28)
RDW: 13.1 % (ref 12.3–15.4)
WBC: 8.9 10*3/uL (ref 3.4–10.8)

## 2015-03-15 LAB — COMPREHENSIVE METABOLIC PANEL
A/G RATIO: 1.8 (ref 1.1–2.5)
ALBUMIN: 4.1 g/dL (ref 3.5–5.5)
ALK PHOS: 90 IU/L (ref 39–117)
ALT: 7 IU/L (ref 0–32)
AST: 15 IU/L (ref 0–40)
BUN / CREAT RATIO: 9 (ref 9–23)
BUN: 8 mg/dL (ref 6–24)
Bilirubin Total: 0.3 mg/dL (ref 0.0–1.2)
CALCIUM: 9.1 mg/dL (ref 8.7–10.2)
CO2: 27 mmol/L (ref 18–29)
CREATININE: 0.94 mg/dL (ref 0.57–1.00)
Chloride: 94 mmol/L — ABNORMAL LOW (ref 96–106)
GFR calc Af Amer: 78 mL/min/{1.73_m2} (ref >59)
GFR, EST NON AFRICAN AMERICAN: 68 mL/min/{1.73_m2} (ref >59)
GLOBULIN, TOTAL: 2.3 g/dL (ref 1.5–4.5)
Glucose: 83 mg/dL (ref 65–99)
POTASSIUM: 3.8 mmol/L (ref 3.5–5.2)
SODIUM: 139 mmol/L (ref 134–144)
Total Protein: 6.4 g/dL (ref 6.0–8.5)

## 2015-03-15 LAB — SEDIMENTATION RATE: Sed Rate: 6 mm/hr (ref 0–40)

## 2015-03-15 LAB — VITAMIN B12: Vitamin B-12: 192 pg/mL — ABNORMAL LOW (ref 211–946)

## 2015-03-15 LAB — TSH: TSH: 4.73 u[IU]/mL — ABNORMAL HIGH (ref 0.450–4.500)

## 2015-03-16 ENCOUNTER — Ambulatory Visit (INDEPENDENT_AMBULATORY_CARE_PROVIDER_SITE_OTHER): Payer: BLUE CROSS/BLUE SHIELD

## 2015-03-16 DIAGNOSIS — E538 Deficiency of other specified B group vitamins: Secondary | ICD-10-CM

## 2015-03-16 MED ORDER — CYANOCOBALAMIN 1000 MCG/ML IJ SOLN
1000.0000 ug | Freq: Once | INTRAMUSCULAR | Status: AC
  Administered 2015-03-16: 1000 ug via INTRAMUSCULAR

## 2015-03-23 ENCOUNTER — Ambulatory Visit (INDEPENDENT_AMBULATORY_CARE_PROVIDER_SITE_OTHER): Payer: BLUE CROSS/BLUE SHIELD

## 2015-03-23 DIAGNOSIS — E538 Deficiency of other specified B group vitamins: Secondary | ICD-10-CM | POA: Diagnosis not present

## 2015-03-23 MED ORDER — CYANOCOBALAMIN 1000 MCG/ML IJ SOLN
1000.0000 ug | Freq: Once | INTRAMUSCULAR | Status: AC
  Administered 2015-03-23: 1000 ug via INTRAMUSCULAR

## 2015-03-30 ENCOUNTER — Ambulatory Visit (INDEPENDENT_AMBULATORY_CARE_PROVIDER_SITE_OTHER): Payer: BLUE CROSS/BLUE SHIELD

## 2015-03-30 DIAGNOSIS — E538 Deficiency of other specified B group vitamins: Secondary | ICD-10-CM | POA: Diagnosis not present

## 2015-03-30 MED ORDER — CYANOCOBALAMIN 1000 MCG/ML IJ SOLN
1000.0000 ug | Freq: Once | INTRAMUSCULAR | Status: AC
  Administered 2015-03-30: 1000 ug via INTRAMUSCULAR

## 2015-04-06 ENCOUNTER — Ambulatory Visit (INDEPENDENT_AMBULATORY_CARE_PROVIDER_SITE_OTHER): Payer: BLUE CROSS/BLUE SHIELD

## 2015-04-06 DIAGNOSIS — E538 Deficiency of other specified B group vitamins: Secondary | ICD-10-CM

## 2015-04-06 MED ORDER — CYANOCOBALAMIN 1000 MCG/ML IJ SOLN
1000.0000 ug | Freq: Once | INTRAMUSCULAR | Status: AC
  Administered 2015-04-06: 1000 ug via INTRAMUSCULAR

## 2015-04-14 ENCOUNTER — Encounter: Payer: Self-pay | Admitting: Family Medicine

## 2015-04-14 ENCOUNTER — Ambulatory Visit (INDEPENDENT_AMBULATORY_CARE_PROVIDER_SITE_OTHER): Payer: BLUE CROSS/BLUE SHIELD | Admitting: Family Medicine

## 2015-04-14 VITALS — BP 126/82 | HR 78 | Temp 97.6°F | Resp 18 | Wt 166.0 lb

## 2015-04-14 DIAGNOSIS — K219 Gastro-esophageal reflux disease without esophagitis: Secondary | ICD-10-CM

## 2015-04-14 DIAGNOSIS — J302 Other seasonal allergic rhinitis: Secondary | ICD-10-CM

## 2015-04-14 MED ORDER — FLUTICASONE-SALMETEROL 250-50 MCG/DOSE IN AEPB: 1.0000 | INHALATION_SPRAY | Freq: Two times a day (BID) | RESPIRATORY_TRACT | Status: DC

## 2015-04-14 MED ORDER — LORATADINE 10 MG PO TABS: 10.0000 mg | ORAL_TABLET | Freq: Every day | ORAL | Status: AC

## 2015-04-14 MED ORDER — OMEPRAZOLE 20 MG PO CPDR: 20.0000 mg | DELAYED_RELEASE_CAPSULE | Freq: Every day | ORAL | Status: AC

## 2015-04-14 NOTE — Progress Notes (Signed)
Patient ID: Katie Werner, female   DOB: 07/31/1955, 60 y.o.   MRN: [id number]    Subjective:  HPI Pt is here for a follow up of Vitamin B 12 deficiency. She reports that she can not tell a difference with how she feels with the B12 shots.   She reports that she has been having shortness of breath, cough (productive), wheezing nasal congestion and chest pain. She does have asthma. She reports that the chest pain is worse when she lays down at night and is a burning pain she is burping a lot. She has not been taking her Omeprazole. Per pt she has had these symptoms for a couple of months now.   Prior to Admission medications   Medication Sig Start Date End Date Taking? Authorizing Provider  albuterol (PROVENTIL HFA;VENTOLIN HFA) 108 (90 BASE) MCG/ACT inhaler Two puffs every 4-6 hours for wheezing or shortness of breath 09/24/14  Yes Anola Gurney, PA  ALPRAZolam Prudy Feeler) 0.5 MG tablet Take 1 tablet (0.5 mg total) by mouth at bedtime as needed for anxiety. 02/18/15  Yes Richard Hulen Shouts., MD  azelastine (ASTELIN) 0.1 % nasal spray instill 2 sprays into each nostril every 12 hours if needed for ALLERGIES 03/04/15  Yes Historical Provider, MD  fluticasone (FLONASE) 50 MCG/ACT nasal spray Place 2 sprays into both nostrils daily. 07/27/14  Yes Anola Gurney, PA  Linaclotide [facility]) 145 MCG CAPS capsule Take 1 capsule (145 mcg total) by mouth daily. 02/24/15  Yes Richard Hulen Shouts., MD  losartan (COZAAR) 50 MG tablet  07/26/14  Yes Historical Provider, MD  traZODone (DESYREL) 100 MG tablet Take 1 tablet (100 mg total) by mouth at bedtime. 02/24/15  Yes Richard Hulen Shouts., MD  venlafaxine XR (EFFEXOR XR) 150 MG 24 hr capsule Take by mouth. 07/02/13  Yes Historical Provider, MD  venlafaxine XR (EFFEXOR-XR) 75 MG 24 hr capsule take 3 capsules by mouth once daily 02/18/15  Yes Richard Hulen Shouts., MD    Patient Active Problem List   Diagnosis Date Noted  . Anxiety 07/27/2014  . Airway  hyperreactivity 07/27/2014  . Acute coryza 07/27/2014  . AB (asthmatic bronchitis) 07/27/2014  . Gout 07/27/2014  . Acid reflux 07/27/2014  . Arthralgia of hip 07/27/2014  . History of asthma 07/27/2014  . History of digestive disease 07/27/2014  . BP (high blood pressure) 07/27/2014  . Avitaminosis D 07/27/2014    Past Medical History  Diagnosis Date  . Depression   . Hypertension   . Gout   . Arthritis   . GERD (gastroesophageal reflux disease)   . Asthma   . Gout   . Constipation     Social History   Social History  . Marital Status: Married    Spouse Name: N/A  . Number of Children: N/A  . Years of Education: N/A   Occupational History  . Not on file.   Social History Main Topics  . Smoking status: Current Some Day Smoker -- 0.25 packs/day for 35 years    Types: Cigarettes  . Smokeless tobacco: Never Used  . Alcohol Use: No  . Drug Use: No  . Sexual Activity: Not Currently   Other Topics Concern  . Not on file   Social History Narrative    Allergies  Allergen Reactions  . Penicillins     breaks out and has trouble breathing    Review of Systems  Constitutional: Positive for malaise/fatigue.  HENT: Positive for congestion.   Eyes:  Negative.   Respiratory: Positive for cough, sputum production (yellowish) and shortness of breath.   Cardiovascular: Positive for chest pain.  Gastrointestinal: Negative.   Genitourinary: Negative.   Musculoskeletal: Negative.   Skin: Negative.   Neurological: Negative.   Endo/Heme/Allergies: Negative.   Psychiatric/Behavioral: Negative.     Immunization History  Administered Date(s) Administered  . Influenza,inj,Quad PF,36+ Mos 11/11/2014  . Pneumococcal Polysaccharide-23 01/16/2011  . Tdap 01/15/2013   Objective:  BP 126/82 mmHg  Pulse 78  Temp(Src) 97.6 F (36.4 C) (Oral)  Resp 18  Wt 166 lb (75.297 kg)  SpO2 99%  LMP  (LMP Unknown)  Physical Exam  Constitutional: She is oriented to person, place,  and time and well-developed, well-nourished, and in no distress.  HENT:  Head: Normocephalic and atraumatic.  Right Ear: External ear normal.  Left Ear: External ear normal.  Nose: Nose normal.  Mouth/Throat: Oropharynx is clear and moist.  Eyes: Conjunctivae are normal.  Neck: Neck supple.  Cardiovascular: Normal rate, regular rhythm and normal heart sounds.   Pulmonary/Chest: Effort normal and breath sounds normal.  Abdominal: Soft.  Neurological: She is alert and oriented to person, place, and time.  Skin: Skin is warm and dry.  Psychiatric: Mood, memory, affect and judgment normal.    Lab Results  Component Value Date   WBC 8.9 03/14/2015   HGB 14.6 05/15/2013   HCT 39.6 03/14/2015   PLT 409* 03/14/2015   GLUCOSE 83 03/14/2015   CHOL 234* 01/25/2015   TRIG 133 01/25/2015   HDL 50 01/25/2015   LDLCALC 157* 01/25/2015   TSH 4.730* 03/14/2015    CMP     Component Value Date/Time   NA 139 03/14/2015 1540   NA 137 05/15/2013 0047   K 3.8 03/14/2015 1540   K 3.7 05/15/2013 0047   CL 94* 03/14/2015 1540   CL 102 05/15/2013 0047   CO2 27 03/14/2015 1540   CO2 30 05/15/2013 0047   GLUCOSE 83 03/14/2015 1540   GLUCOSE 99 05/15/2013 0047   BUN 8 03/14/2015 1540   BUN 15 05/15/2013 0047   CREATININE 0.94 03/14/2015 1540   CREATININE 1.21 05/15/2013 0047   CALCIUM 9.1 03/14/2015 1540   CALCIUM 8.6 05/15/2013 0047   PROT 6.4 03/14/2015 1540   PROT 7.0 05/15/2013 0047   ALBUMIN 4.1 03/14/2015 1540   ALBUMIN 3.7 05/15/2013 0047   AST 15 03/14/2015 1540   AST 13* 05/15/2013 0047   ALT 7 03/14/2015 1540   ALT 27 05/15/2013 0047   ALKPHOS 90 03/14/2015 1540   ALKPHOS 102 05/15/2013 0047   BILITOT 0.3 03/14/2015 1540   BILITOT 0.4 05/15/2013 0047   GFRNONAA 68 03/14/2015 1540   GFRNONAA 50* 05/15/2013 0047   GFRAA 78 03/14/2015 1540   GFRAA 58* 05/15/2013 0047    Assessment and Plan :  1. Gastroesophageal reflux disease, esophagitis presence not specified -  omeprazole (PRILOSEC) 20 MG capsule; Take 1 capsule (20 mg total) by mouth daily.  Dispense: 30 capsule; Refill: 12  2. Seasonal allergies  - loratadine (CLARITIN) 10 MG tablet; Take 1 tablet (10 mg total) by mouth daily.  Dispense: 30 tablet; Refill: 11 - Fluticasone-Salmeterol (ADVAIR) 250-50 MCG/DOSE AEPB; Inhale 1 puff into the lungs 2 (two) times daily.  Dispense: 60 each; Refill: 3 3. Major depressive disorder/generalized anxiety disorder I think this is currently getting worse. The death of her mother late last year I think put her over the edge. May need psychiatric referral. 4. B12 deficiency Continue  monthly shot for now 5. Mild asthma I think early pollen season has caused this to flare. May need spirometry. May need pulmonary referral. I have done the exam and reviewed the above chart and it is accurate to the best of my knowledge.  Julieanne Manson MD [facility] 04/14/2015 10:47 AM

## 2015-04-25 ENCOUNTER — Ambulatory Visit: Payer: BLUE CROSS/BLUE SHIELD | Admitting: Family Medicine

## 2015-04-26 ENCOUNTER — Other Ambulatory Visit: Payer: Self-pay | Admitting: Neurology

## 2015-04-26 DIAGNOSIS — G3184 Mild cognitive impairment, so stated: Secondary | ICD-10-CM

## 2015-05-05 ENCOUNTER — Ambulatory Visit (INDEPENDENT_AMBULATORY_CARE_PROVIDER_SITE_OTHER): Payer: BLUE CROSS/BLUE SHIELD | Admitting: Family Medicine

## 2015-05-05 VITALS — BP 138/80 | HR 68 | Temp 97.5°F | Resp 16 | Wt 165.0 lb

## 2015-05-05 DIAGNOSIS — F32A Depression, unspecified: Secondary | ICD-10-CM

## 2015-05-05 DIAGNOSIS — E039 Hypothyroidism, unspecified: Secondary | ICD-10-CM | POA: Diagnosis not present

## 2015-05-05 DIAGNOSIS — F329 Major depressive disorder, single episode, unspecified: Secondary | ICD-10-CM | POA: Diagnosis not present

## 2015-05-05 DIAGNOSIS — E631 Imbalance of constituents of food intake: Secondary | ICD-10-CM

## 2015-05-05 DIAGNOSIS — E538 Deficiency of other specified B group vitamins: Secondary | ICD-10-CM

## 2015-05-05 MED ORDER — DULOXETINE HCL 30 MG PO CPEP
30.0000 mg | ORAL_CAPSULE | Freq: Every day | ORAL | Status: AC
Start: 2015-05-05 — End: ?

## 2015-05-05 MED ORDER — LEVOTHYROXINE SODIUM 25 MCG PO TABS: 25.0000 ug | ORAL_TABLET | Freq: Every day | ORAL | Status: AC

## 2015-05-05 NOTE — Progress Notes (Signed)
Patient ID: Katie Werner, female   DOB: [birth year], 60 y.o.   MRN: [id number]   Katie Werner  MRN: [id number] DOB: [birth year]  Subjective:  HPI   1. B12 deficiency The patient is a 60 year old female who presents for follow up of B12 deficiency after receiving weekly B12 shots times 4.  The patient is accompanied by her husband and they both agree that there has not been any significant improvement in the way the patient feels.  They did say that the first shot or two had some effect on her energy level for a couple of days but it did not last and then the next few shots did not have any effect on her.   It is also of note that the patient had a TSH that was mildly elevated on her last blood draw.  The level was 4.730.  The patient states that both her mother and daughter had hypothyroidism and were treated with Levothyroxine.  Both of them had symptoms of fatigue, her daughter also has difficulty losing weight. It is uncertain if there was any depression with her mother but the husband states she had "bouts" of depression.    Patient Active Problem List   Diagnosis Date Noted  . Anxiety 07/27/2014  . 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 Acute coryza 07/27/2014  . AB (asthmatic bronchitis) 07/27/2014  . Gout 07/27/2014  . Acid reflux 07/27/2014  . Arthralgia of hip 07/27/2014  . History of asthma 07/27/2014  . History of digestive disease 07/27/2014  . BP (high blood pressure) 07/27/2014  . Avitaminosis D 07/27/2014    Past Medical History  Diagnosis Date  . Depression   . Hypertension   . Gout   . Arthritis   . GERD (gastroesophageal reflux disease)   . Asthma   . Gout   . Constipation     Social History   Social History  . Marital Status: Married    Spouse Name: N/A  . Number of Children: N/A  . Years of Education: N/A   Occupational History  . Not on file.   Social History Main Topics  . Smoking status: Current Some Day Smoker -- 0.25 packs/day for 35 years    Types: Cigarettes  . Smokeless tobacco: Never Used  . Alcohol Use: No  . Drug Use: No  . Sexual Activity: Not Currently   Other Topics Concern  . Not on file   Social History Narrative    Outpatient Prescriptions Prior to Visit  Medication Sig Dispense Refill  . albuterol (PROVENTIL HFA;VENTOLIN HFA) 108 (90 BASE) MCG/ACT inhaler Two puffs every 4-6 hours for wheezing or shortness of breath 1 Inhaler 1  . ALPRAZolam (XANAX) 0.5 MG tablet Take 1 tablet (0.5 mg total) by mouth at bedtime as needed for anxiety. 30 tablet 2  . azelastine (ASTELIN) 0.1 % nasal spray instill 2 sprays into each nostril every 12 hours if needed for ALLERGIES  1  . fluticasone (FLONASE) 50 MCG/ACT nasal spray Place 2 sprays into both nostrils daily. 16 g 6  . Fluticasone-Salmeterol (ADVAIR) 250-50 MCG/DOSE AEPB Inhale 1 puff into the lungs 2 (two) times daily. 60 each 3  . Linaclotide (LINZESS) 145 MCG CAPS capsule Take 1 capsule (145 mcg total) by mouth daily. 30 capsule 5  . loratadine (CLARITIN) 10 MG tablet Take 1 tablet (10 mg total) by mouth daily. 30 tablet 11  . losartan (COZAAR) 50 MG tablet   0  . omeprazole (PRILOSEC) 20 MG  capsule Take 1 capsule (20 mg total) by mouth daily. 30 capsule 12  . traZODone (DESYREL) 100 MG tablet Take 1 tablet (100 mg total) by mouth at bedtime. 30 tablet 5  . venlafaxine XR (EFFEXOR XR) 150 MG 24 hr capsule Take by mouth.    . venlafaxine XR (EFFEXOR-XR) 75 MG 24 hr capsule take 3 capsules by mouth once daily 90 capsule 12   No facility-administered medications prior to visit.    Allergies  Allergen Reactions  . Penicillins     breaks out and has trouble breathing    Review of Systems  Constitutional: Positive for malaise/fatigue. Negative for fever and weight loss.  Eyes: Negative.   Respiratory: Positive for wheezing. Negative for cough and shortness of breath.   Cardiovascular: Positive for chest pain. Negative for palpitations, orthopnea and leg swelling.    Musculoskeletal: Negative.   Neurological: Positive for weakness and headaches. Negative for dizziness.  Endo/Heme/Allergies: Negative.   Psychiatric/Behavioral: Positive for memory loss. Negative for depression, suicidal ideas, hallucinations and substance abuse. The patient is nervous/anxious and has insomnia.    Objective:  BP 138/80 mmHg  Pulse 68  Temp(Src) 97.5 F (36.4 C) (Oral)  Resp 16  Wt 165 lb (74.844 kg)  LMP  (LMP Unknown)  Physical Exam  Constitutional: She is oriented to person, place, and time and well-developed, well-nourished, and in no distress.  HENT:  Head: Normocephalic and atraumatic.  Right Ear: External ear normal.  Left Ear: External ear normal.  Eyes: Conjunctivae are normal.  Neck: Neck supple. No thyromegaly present.  Cardiovascular: Normal rate, regular rhythm and normal heart sounds.   Pulmonary/Chest: Effort normal and breath sounds normal.  Abdominal: Soft.  Lymphadenopathy:    She has no cervical adenopathy.  Neurological: She is alert and oriented to person, place, and time. Gait normal.  Skin: Skin is warm and dry.  Psychiatric: Mood, memory and judgment normal.  Patient obviously agitated.    Assessment and Plan :   1. B12 deficiency Will follow up in 6 weeks. - Amb Referral to Nutrition and Diabetic E More than 50% of visit spent in counseling regarding these issues. 2. Hypothyroidism, unspecified hypothyroidism type Follow up in 6 weeks - levothyroxine (SYNTHROID, LEVOTHROID) 25 MCG tablet; Take 1 tablet (25 mcg total) by mouth daily before breakfast.  Dispense: 30 tablet; Refill: 12  3. Dietary imbalance/Obesity   - Amb Referral to Nutrition and Diabetic E  4. Depression  Follow up in 6 weeks. - DULoxetine (CYMBALTA) 30 MG capsule; Take 1 capsule (30 mg total) by mouth daily.  Dispense: 30 capsule; Refill: 12 5. Chronic anxiety/insomnia Patient not suicidal. I definitely think she needs referral to psychiatry.Husband  agrees. I have done the exam and reviewed the above chart and it is accurate to the best of my knowledge.  Julieanne Mansonichard Gilbert MD [facility] 05/05/2015 2:28 PM

## 2015-05-16 ENCOUNTER — Ambulatory Visit
Admission: RE | Admit: 2015-05-16 | Discharge: 2015-05-16 | Disposition: A | Discharge status: Home / Self Care | Payer: BLUE CROSS/BLUE SHIELD | Source: Ambulatory Visit | Attending: Neurology | Admitting: Neurology

## 2015-05-16 DIAGNOSIS — R9089 Other abnormal findings on diagnostic imaging of central nervous system: Secondary | ICD-10-CM | POA: Insufficient documentation

## 2015-05-16 DIAGNOSIS — G3184 Mild cognitive impairment, so stated: Secondary | ICD-10-CM | POA: Insufficient documentation

## 2015-05-20 ENCOUNTER — Other Ambulatory Visit: Payer: Self-pay | Admitting: Family Medicine

## 2015-05-20 DIAGNOSIS — I10 Essential (primary) hypertension: Secondary | ICD-10-CM

## 2015-05-20 MED ORDER — LOSARTAN POTASSIUM 50 MG PO TABS: 50.0000 mg | ORAL_TABLET | Freq: Every day | ORAL | Status: DC

## 2015-05-20 NOTE — Telephone Encounter (Signed)
Pt contacted office for refill request on the following medications:  losartan (COZAAR) 50 MG tablet.  [facility] [address]  [phone]/[place]  This is a Dr Sullivan LoneGilbert pt.  Pt husband states pt is out of medication/MW

## 2015-05-30 ENCOUNTER — Ambulatory Visit
Admission: RE | Admit: 2015-05-30 | Discharge: 2015-05-30 | Disposition: A | Discharge status: Home / Self Care | Payer: BLUE CROSS/BLUE SHIELD | Source: Ambulatory Visit | Attending: Otolaryngology | Admitting: Otolaryngology

## 2015-05-30 DIAGNOSIS — R221 Localized swelling, mass and lump, neck: Secondary | ICD-10-CM | POA: Diagnosis not present

## 2015-06-01 ENCOUNTER — Encounter: Payer: Self-pay | Admitting: Family Medicine

## 2015-06-01 ENCOUNTER — Ambulatory Visit (INDEPENDENT_AMBULATORY_CARE_PROVIDER_SITE_OTHER): Payer: BLUE CROSS/BLUE SHIELD | Admitting: Family Medicine

## 2015-06-01 VITALS — BP 128/76 | HR 80 | Temp 97.7°F | Resp 14 | Wt 160.0 lb

## 2015-06-01 DIAGNOSIS — G3109 Other frontotemporal dementia: Secondary | ICD-10-CM | POA: Diagnosis not present

## 2015-06-01 DIAGNOSIS — Z8709 Personal history of other diseases of the respiratory system: Secondary | ICD-10-CM

## 2015-06-01 DIAGNOSIS — E538 Deficiency of other specified B group vitamins: Secondary | ICD-10-CM

## 2015-06-01 DIAGNOSIS — F0280 Dementia in other diseases classified elsewhere without behavioral disturbance: Secondary | ICD-10-CM

## 2015-06-01 DIAGNOSIS — J302 Other seasonal allergic rhinitis: Secondary | ICD-10-CM

## 2015-06-01 MED ORDER — ALBUTEROL SULFATE HFA 108 (90 BASE) MCG/ACT IN AERS: INHALATION_SPRAY | RESPIRATORY_TRACT | Status: AC

## 2015-06-01 MED ORDER — CYANOCOBALAMIN 1000 MCG/ML IJ SOLN
1000.0000 ug | Freq: Once | INTRAMUSCULAR | Status: AC
  Administered 2015-06-01: 1000 ug via INTRAMUSCULAR

## 2015-06-01 MED ORDER — FLUTICASONE-SALMETEROL 250-50 MCG/DOSE IN AEPB: 1.0000 | INHALATION_SPRAY | Freq: Two times a day (BID) | RESPIRATORY_TRACT | Status: AC

## 2015-06-01 NOTE — Progress Notes (Signed)
Patient ID: Katie Werner, female   DOB: 08/25/1955, 60 y.o.   MRN: [id number]    Subjective:  HPI  Patient would like to discuss getting more samples or a RX for Advair and Proventil to use. She does have asthma and she does smoke daily but states she is going to quit. She has had some cough maybe for 2 weeks with yellow phlegm coming up at times. No fever.  She also would like to get a B 12 injection today.  Prior to Admission medications   Medication Sig Start Date End Date Taking? Authorizing Provider  albuterol (PROVENTIL HFA;VENTOLIN HFA) 108 (90 BASE) MCG/ACT inhaler Two puffs every 4-6 hours for wheezing or shortness of breath 09/24/14  Yes Anola Gurney, PA  ALPRAZolam Prudy Feeler) 0.5 MG tablet Take 1 tablet (0.5 mg total) by mouth at bedtime as needed for anxiety. 02/18/15  Yes Richard Hulen Shouts., MD  azelastine (ASTELIN) 0.1 % nasal spray instill 2 sprays into each nostril every 12 hours if needed for ALLERGIES 03/04/15  Yes Historical Provider, MD  DULoxetine (CYMBALTA) 30 MG capsule Take 1 capsule (30 mg total) by mouth daily. 05/05/15  Yes Richard Hulen Shouts., MD  fluticasone [facility]) 50 MCG/ACT nasal spray Place 2 sprays into both nostrils daily. 07/27/14  Yes Anola Gurney, PA  Fluticasone-Salmeterol (ADVAIR) 250-50 MCG/DOSE AEPB Inhale 1 puff into the lungs 2 (two) times daily. 04/14/15  Yes Richard Hulen Shouts., MD  levothyroxine (SYNTHROID, LEVOTHROID) 25 MCG tablet Take 1 tablet (25 mcg total) by mouth daily before breakfast. 05/05/15  Yes Maple Hudson., MD  Linaclotide [facility]) 145 MCG CAPS capsule Take 1 capsule (145 mcg total) by mouth daily. 02/24/15  Yes Richard Hulen Shouts., MD  loratadine (CLARITIN) 10 MG tablet Take 1 tablet (10 mg total) by mouth daily. 04/14/15  Yes Richard Hulen Shouts., MD  losartan (COZAAR) 50 MG tablet Take 1 tablet (50 mg total) by mouth daily. 05/20/15  Yes Lorie Phenix, MD  omeprazole (PRILOSEC) 20 MG capsule Take 1 capsule (20 mg total)  by mouth daily. 04/14/15  Yes Richard Hulen Shouts., MD  traZODone (DESYREL) 100 MG tablet Take 1 tablet (100 mg total) by mouth at bedtime. 02/24/15  Yes Richard Hulen Shouts., MD    Patient Active Problem List   Diagnosis Date Noted  . Anxiety 07/27/2014  . 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 Acute coryza 07/27/2014  . AB (asthmatic bronchitis) 07/27/2014  . Gout 07/27/2014  . Acid reflux 07/27/2014  . Arthralgia of hip 07/27/2014  . History of asthma 07/27/2014  . History of digestive disease 07/27/2014  . BP (high blood pressure) 07/27/2014  . Avitaminosis D 07/27/2014    Past Medical History  Diagnosis Date  . Depression   . Hypertension   . Gout   . Arthritis   . GERD (gastroesophageal reflux disease)   . Asthma   . Gout   . Constipation     Social History   Social History  . Marital Status: Married    Spouse Name: N/A  . Number of Children: N/A  . Years of Education: N/A   Occupational History  . Not on file.   Social History Main Topics  . Smoking status: Current Every Day Smoker -- 0.25 packs/day for 35 years    Types: Cigarettes  . Smokeless tobacco: Never Used  . Alcohol Use: No  . Drug Use: No  . Sexual Activity: Not Currently   Other Topics  Concern  . Not on file   Social History Narrative    Allergies  Allergen Reactions  . Penicillins     breaks out and has trouble breathing    Review of Systems  Constitutional: Negative.   HENT: Negative.   Eyes: Negative.   Respiratory: Positive for cough and sputum production.   Cardiovascular: Negative.   Gastrointestinal: Negative.   Musculoskeletal: Negative.   Skin: Negative.   Neurological: Negative.   Endo/Heme/Allergies: Negative.   Psychiatric/Behavioral: Positive for memory loss. The patient is nervous/anxious.     Immunization History  Administered Date(s) Administered  . Influenza,inj,Quad PF,36+ Mos 11/11/2014  . Pneumococcal Polysaccharide-23 01/16/2011  . Tdap 01/15/2013    Objective:  BP 128/76 mmHg  Pulse 80  Temp(Src) 97.7 F (36.5 C)  Resp 14  Wt 160 lb (72.576 kg)  SpO2 93%  LMP  (LMP Unknown)  Physical Exam  Constitutional: She is well-developed, well-nourished, and in no distress.  HENT:  Head: Normocephalic and atraumatic.  Right Ear: External ear normal.  Left Ear: External ear normal.  Nose: Nose normal.  Eyes: Conjunctivae are normal.  Neck: Neck supple. No thyromegaly present.  Cardiovascular: Normal rate, regular rhythm and normal heart sounds.   Pulmonary/Chest: Effort normal and breath sounds normal.  Abdominal: Soft.  Neurological: She is alert.  Skin: Skin is warm and dry.  Psychiatric: Mood, memory and judgment normal.  Affect unusual  consistent with her new diagnosis of frontotemporal dementia    Lab Results  Component Value Date   WBC 8.9 03/14/2015   HGB 14.6 05/15/2013   HCT 39.6 03/14/2015   PLT 409* 03/14/2015   GLUCOSE 83 03/14/2015   CHOL 234* 01/25/2015   TRIG 133 01/25/2015   HDL 50 01/25/2015   LDLCALC 157* 01/25/2015   TSH 4.730* 03/14/2015    CMP     Component Value Date/Time   NA 139 03/14/2015 1540   NA 137 05/15/2013 0047   K 3.8 03/14/2015 1540   K 3.7 05/15/2013 0047   CL 94* 03/14/2015 1540   CL 102 05/15/2013 0047   CO2 27 03/14/2015 1540   CO2 30 05/15/2013 0047   GLUCOSE 83 03/14/2015 1540   GLUCOSE 99 05/15/2013 0047   BUN 8 03/14/2015 1540   BUN 15 05/15/2013 0047   CREATININE 0.94 03/14/2015 1540   CREATININE 1.21 05/15/2013 0047   CALCIUM 9.1 03/14/2015 1540   CALCIUM 8.6 05/15/2013 0047   PROT 6.4 03/14/2015 1540   PROT 7.0 05/15/2013 0047   ALBUMIN 4.1 03/14/2015 1540   ALBUMIN 3.7 05/15/2013 0047   AST 15 03/14/2015 1540   AST 13* 05/15/2013 0047   ALT 7 03/14/2015 1540   ALT 27 05/15/2013 0047   ALKPHOS 90 03/14/2015 1540   ALKPHOS 102 05/15/2013 0047   BILITOT 0.3 03/14/2015 1540   BILITOT 0.4 05/15/2013 0047   GFRNONAA 68 03/14/2015 1540   GFRNONAA 50*  05/15/2013 0047   GFRAA 78 03/14/2015 1540   GFRAA 58* 05/15/2013 0047    Assessment and Plan :  1. Seasonal allergies Refill provided on inhalers. - albuterol (PROVENTIL HFA;VENTOLIN HFA) 108 (90 Base) MCG/ACT inhaler; Two puffs every 4-6 hours for wheezing or shortness of breath  Dispense: 1 Inhaler; Refill: 12 - Fluticasone-Salmeterol (ADVAIR) 250-50 MCG/DOSE AEPB; Inhale 1 puff into the lungs 2 (two) times daily.  Dispense: 60 each; Refill: 12  2. History of asthma Refills provided. - albuterol (PROVENTIL HFA;VENTOLIN HFA) 108 (90 Base) MCG/ACT inhaler; Two puffs every 4-6  hours for wheezing or shortness of breath  Dispense: 1 Inhaler; Refill: 12 - Fluticasone-Salmeterol (ADVAIR) 250-50 MCG/DOSE AEPB; Inhale 1 puff into the lungs 2 (two) times daily.  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 Patient is following Dr. Sherryll BurgerShah. She is on Aricept at this time.  4. B12 deficiency Administered today  - cyanocobalamin ((VITAMIN B-12)) injection 1,000 mcg; Inject 1 mL (1,000 mcg total) into the muscle once. 5. Tobacco abuse Patient encouraged to discontinue Patient was seen and examined by Dr. Bosie Closichard L Gilbert and note was scribed by Samara DeistAnastasiya Aleksandrova, RMA. I have done the exam and reviewed the above chart and it is accurate to the best of my knowledge.   Julieanne Mansonichard Gilbert MD [facility] 06/01/2015 3:38 PM

## 2015-06-03 ENCOUNTER — Ambulatory Visit: Payer: BLUE CROSS/BLUE SHIELD

## 2015-06-03 ENCOUNTER — Ambulatory Visit: Payer: No Typology Code available for payment source | Admitting: Dietician

## 2015-06-14 ENCOUNTER — Ambulatory Visit (INDEPENDENT_AMBULATORY_CARE_PROVIDER_SITE_OTHER): Payer: BLUE CROSS/BLUE SHIELD | Admitting: Family Medicine

## 2015-06-14 VITALS — BP 132/76 | HR 78 | Temp 98.4°F | Resp 12 | Wt 159.0 lb

## 2015-06-14 DIAGNOSIS — M899 Disorder of bone, unspecified: Secondary | ICD-10-CM | POA: Diagnosis not present

## 2015-06-14 DIAGNOSIS — K219 Gastro-esophageal reflux disease without esophagitis: Secondary | ICD-10-CM | POA: Diagnosis not present

## 2015-06-14 DIAGNOSIS — J302 Other seasonal allergic rhinitis: Secondary | ICD-10-CM

## 2015-06-14 DIAGNOSIS — I10 Essential (primary) hypertension: Secondary | ICD-10-CM | POA: Diagnosis not present

## 2015-06-14 DIAGNOSIS — K589 Irritable bowel syndrome without diarrhea: Secondary | ICD-10-CM

## 2015-06-14 DIAGNOSIS — M898X9 Other specified disorders of bone, unspecified site: Secondary | ICD-10-CM

## 2015-06-14 DIAGNOSIS — R5383 Other fatigue: Secondary | ICD-10-CM | POA: Diagnosis not present

## 2015-06-14 DIAGNOSIS — G3184 Mild cognitive impairment, so stated: Secondary | ICD-10-CM

## 2015-06-14 DIAGNOSIS — M940 Chondrocostal junction syndrome [Tietze]: Secondary | ICD-10-CM

## 2015-06-14 MED ORDER — LOSARTAN POTASSIUM 50 MG PO TABS: 50.0000 mg | ORAL_TABLET | Freq: Every day | ORAL | Status: AC

## 2015-06-14 MED ORDER — HYOSCYAMINE SULFATE 0.125 MG PO TABS: 0.1250 mg | ORAL_TABLET | Freq: Two times a day (BID) | ORAL | Status: AC

## 2015-06-14 NOTE — Progress Notes (Signed)
Patient ID: Katie Werner, female   DOB: [birth year], 60 y.o.   MRN: [id number]    Subjective:  HPI  Patient states that she has had some pain and burning in the mid of her chest for the past 2 months maybe per patient, she is not sure if this has gotten worse or not. No pattern to the pain and whether foods affect it. She also has a lot of burping, excessive gas, lower abdominal pain to the right side mainly, stools are loose at times and harder at other times, has had a productive cough the past 2 days. [place] and per husband she had no stemina to do things with others and constantly had to go and rest. She has been taking Omeprazole but not consistantly and she is out of this medication currently. Also per husband patient was given Hyoscamine in 2015 by Dr. Gustavo Lah for IBS with no refills and husband wanted to see if that's something patient should try again. Per husband does not matter what patient eats but within 15 minutes she has a bowel movement. She is not eating a lot. Wt Readings from Last 3 Encounters:  06/14/15 159 lb (72.122 kg)  06/01/15 160 lb (72.576 kg)  05/05/15 165 lb (74.844 kg)     Prior to Admission medications   Medication Sig Start Date End Date Taking? Authorizing Provider  albuterol (PROVENTIL HFA;VENTOLIN HFA) 108 (90 Base) MCG/ACT inhaler Two puffs every 4-6 hours for wheezing or shortness of breath 06/01/15  Yes Richard Maceo Pro., MD  ALPRAZolam Duanne Moron) 0.5 MG tablet Take 1 tablet (0.5 mg total) by mouth at bedtime as needed for anxiety. 02/18/15  Yes Richard Maceo Pro., MD  azelastine (ASTELIN) 0.1 % nasal spray instill 2 sprays into each nostril every 12 hours if needed for ALLERGIES 03/04/15  Yes Historical Provider, MD  donepezil (ARICEPT) 5 MG tablet Take 5 mg by mouth at bedtime.   Yes Historical Provider, MD  DULoxetine (CYMBALTA) 30 MG capsule Take 1 capsule (30 mg total) by mouth daily. 05/05/15  Yes Richard Maceo Pro., MD  fluticasone  [facility]) 50 MCG/ACT nasal spray Place 2 sprays into both nostrils daily. 07/27/14  Yes Carmon Ginsberg, PA  Fluticasone-Salmeterol (ADVAIR) 250-50 MCG/DOSE AEPB Inhale 1 puff into the lungs 2 (two) times daily. 06/01/15  Yes Richard Maceo Pro., MD  levothyroxine (SYNTHROID, LEVOTHROID) 25 MCG tablet Take 1 tablet (25 mcg total) by mouth daily before breakfast. 05/05/15  Yes Jerrol Banana., MD  loratadine (CLARITIN) 10 MG tablet Take 1 tablet (10 mg total) by mouth daily. 04/14/15  Yes Richard Maceo Pro., MD  losartan (COZAAR) 50 MG tablet Take 1 tablet (50 mg total) by mouth daily. 05/20/15  Yes Margarita Rana, MD  traZODone (DESYREL) 100 MG tablet Take 1 tablet (100 mg total) by mouth at bedtime. 02/24/15  Yes Richard Maceo Pro., MD  venlafaxine (EFFEXOR) 75 MG tablet Take 75 mg by mouth daily. 3 tablets daily   Yes Historical Provider, MD  omeprazole (PRILOSEC) 20 MG capsule Take 1 capsule (20 mg total) by mouth daily. Patient not taking: Reported on 06/14/2015 04/14/15   Jerrol Banana., MD    Patient Active Problem List   Diagnosis Date Noted  . Anxiety 07/27/2014  . 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 Acute coryza 07/27/2014  . AB (asthmatic bronchitis) 07/27/2014  . Gout 07/27/2014  . Acid reflux 07/27/2014  . Arthralgia of hip 07/27/2014  .  History of asthma 07/27/2014  . History of digestive disease 07/27/2014  . BP (high blood pressure) 07/27/2014  . Avitaminosis D 07/27/2014    Past Medical History  Diagnosis Date  . Depression   . Hypertension   . Gout   . Arthritis   . GERD (gastroesophageal reflux disease)   . Asthma   . Gout   . Constipation     Social History   Social History  . Marital Status: Married    Spouse Name: N/A  . Number of Children: N/A  . Years of Education: N/A   Occupational History  . Not on file.   Social History Main Topics  . Smoking status: Current Every Day Smoker -- 0.25 packs/day for 35 years    Types: Cigarettes  .  Smokeless tobacco: Never Used  . Alcohol Use: No  . Drug Use: No  . Sexual Activity: Not Currently   Other Topics Concern  . Not on file   Social History Narrative    Allergies  Allergen Reactions  . Penicillins     breaks out and has trouble breathing    Review of Systems  Constitutional: Positive for malaise/fatigue.  Eyes: Negative.   Respiratory: Positive for cough and sputum production.   Cardiovascular: Positive for chest pain. Negative for palpitations and claudication.  Gastrointestinal: Positive for heartburn, nausea, abdominal pain, diarrhea and constipation.  Musculoskeletal: Positive for myalgias and joint pain.  Skin: Negative.   Neurological: Negative.   Endo/Heme/Allergies: Negative.   Psychiatric/Behavioral: Negative.     Immunization History  Administered Date(s) Administered  . Influenza,inj,Quad PF,36+ Mos 11/11/2014  . Pneumococcal Polysaccharide-23 01/16/2011  . Tdap 01/15/2013   Objective:  BP 132/76 mmHg  Pulse 78  Temp(Src) 98.4 F (36.9 C)  Resp 12  Wt 159 lb (72.122 kg)  LMP  (LMP Unknown)  Physical Exam  Constitutional: She is well-developed, well-nourished, and in no distress.  Eyes: Conjunctivae are normal. Pupils are equal, round, and reactive to light.  Cardiovascular: Normal rate, regular rhythm, normal heart sounds and intact distal pulses.   No murmur heard. Pulmonary/Chest: Effort normal. No respiratory distress. She has wheezes (mild inspiratory wheezes in the bases).  Abdominal: Soft. There is no tenderness. There is no rebound.  Musculoskeletal:  Tender over the sternum and the surrounding anterior chest.  Neurological: She is alert.    Lab Results  Component Value Date   WBC 8.9 03/14/2015   HGB 14.6 05/15/2013   HCT 39.6 03/14/2015   PLT 409* 03/14/2015   GLUCOSE 83 03/14/2015   CHOL 234* 01/25/2015   TRIG 133 01/25/2015   HDL 50 01/25/2015   LDLCALC 157* 01/25/2015   TSH 4.730* 03/14/2015    CMP       Component Value Date/Time   NA 139 03/14/2015 1540   NA 137 05/15/2013 0047   K 3.8 03/14/2015 1540   K 3.7 05/15/2013 0047   CL 94* 03/14/2015 1540   CL 102 05/15/2013 0047   CO2 27 03/14/2015 1540   CO2 30 05/15/2013 0047   GLUCOSE 83 03/14/2015 1540   GLUCOSE 99 05/15/2013 0047   BUN 8 03/14/2015 1540   BUN 15 05/15/2013 0047   CREATININE 0.94 03/14/2015 1540   CREATININE 1.21 05/15/2013 0047   CALCIUM 9.1 03/14/2015 1540   CALCIUM 8.6 05/15/2013 0047   PROT 6.4 03/14/2015 1540   PROT 7.0 05/15/2013 0047   ALBUMIN 4.1 03/14/2015 1540   ALBUMIN 3.7 05/15/2013 0047   AST 15 03/14/2015 1540  AST 13* 05/15/2013 0047   ALT 7 03/14/2015 1540   ALT 27 05/15/2013 0047   ALKPHOS 90 03/14/2015 1540   ALKPHOS 102 05/15/2013 0047   BILITOT 0.3 03/14/2015 1540   BILITOT 0.4 05/15/2013 0047   GFRNONAA 68 03/14/2015 1540   GFRNONAA 50* 05/15/2013 0047   GFRAA 78 03/14/2015 1540   GFRAA 58* 05/15/2013 0047    Assessment and Plan :  1. Gastroesophageal reflux disease, esophagitis presence not specified Worsening. I think her symptoms are multifactorial. Advised patient and husband to take Omeprazole daily in the morning and take Hyoscamine twice daily. Re check in 1 month.  Check labs today-pending results. - CBC with Differential/Platelet - Comprehensive metabolic panel  2. MCI (mild cognitive impairment) with memory loss/recent diagnosis of frontotemporal dementia  3. Seasonal allergies Continue Loratadine may need to add Singulair also, follow up on the next visit.  4. Costochondritis Tender on the exam. Follow for now.  5. IBS (irritable bowel syndrome) Refill medication today, originally started by Dr. Gustavo Lah in 2015, follow up in 1 month to check for any improvement. - hyoscyamine (LEVSIN, ANASPAZ) 0.125 MG tablet; Take 1 tablet (0.125 mg total) by mouth 2 (two) times daily.  Dispense: 60 tablet; Refill: 12  6. Bone pain Patient is having arthralgia multiple areas,  will check labs, patient is concerned that she may have cancer. Pending results. - TSH - Sed Rate (ESR) - Comprehensive metabolic panel - Protein Electrophoresis, (serum)  7. Other fatigue - CBC with Differential/Platelet - TSH  8. Essential hypertension Refill given. - losartan (COZAAR) 50 MG tablet; Take 1 tablet (50 mg total) by mouth daily.  Dispense: 30 tablet; Refill: 12 9. Chronic depression and anxiety. I think this issue is the etiology of the concern about cancer above. Patient was seen and examined by Dr. Eulas Post and note was scribed by Theressa Millard, RMA.   Miguel Aschoff MD [facility] 06/14/2015 8:14 AM

## 2015-06-16 ENCOUNTER — Ambulatory Visit: Payer: BLUE CROSS/BLUE SHIELD | Admitting: Family Medicine

## 2015-06-16 LAB — COMPREHENSIVE METABOLIC PANEL
ALK PHOS: 85 IU/L (ref 39–117)
ALT: 13 IU/L (ref 0–32)
AST: 13 IU/L (ref 0–40)
Albumin/Globulin Ratio: 2 (ref 1.2–2.2)
Albumin: 3.9 g/dL (ref 3.5–5.5)
BILIRUBIN TOTAL: 0.4 mg/dL (ref 0.0–1.2)
BUN/Creatinine Ratio: 4 — ABNORMAL LOW (ref 9–23)
BUN: 4 mg/dL — AB (ref 6–24)
CHLORIDE: 93 mmol/L — AB (ref 96–106)
CO2: 30 mmol/L — ABNORMAL HIGH (ref 18–29)
Calcium: 9.2 mg/dL (ref 8.7–10.2)
Creatinine, Ser: 1.07 mg/dL — ABNORMAL HIGH (ref 0.57–1.00)
GFR calc Af Amer: 67 mL/min/{1.73_m2} (ref >59)
GFR calc non Af Amer: 58 mL/min/{1.73_m2} — ABNORMAL LOW (ref >59)
GLUCOSE: 112 mg/dL — AB (ref 65–99)
Globulin, Total: 2 g/dL (ref 1.5–4.5)
Potassium: 3.2 mmol/L — ABNORMAL LOW (ref 3.5–5.2)
Sodium: 142 mmol/L (ref 134–144)
TOTAL PROTEIN: 5.9 g/dL — AB (ref 6.0–8.5)

## 2015-06-16 LAB — CBC WITH DIFFERENTIAL/PLATELET
BASOS ABS: 0 10*3/uL (ref 0.0–0.2)
Basos: 0 %
EOS (ABSOLUTE): 0.2 10*3/uL (ref 0.0–0.4)
Eos: 2 %
HEMATOCRIT: 39.6 % (ref 34.0–46.6)
Hemoglobin: 13 g/dL (ref 11.1–15.9)
Immature Grans (Abs): 0 10*3/uL (ref 0.0–0.1)
Immature Granulocytes: 0 %
LYMPHS ABS: 2.4 10*3/uL (ref 0.7–3.1)
LYMPHS: 37 %
MCH: 29.3 pg (ref 26.6–33.0)
MCHC: 32.8 g/dL (ref 31.5–35.7)
MCV: 89 fL (ref 79–97)
Monocytes Absolute: 0.3 10*3/uL (ref 0.1–0.9)
Monocytes: 4 %
NEUTROS ABS: 3.8 10*3/uL (ref 1.4–7.0)
Neutrophils: 57 %
Platelets: 367 10*3/uL (ref 150–379)
RBC: 4.44 x10E6/uL (ref 3.77–5.28)
RDW: 13.1 % (ref 12.3–15.4)
WBC: 6.6 10*3/uL (ref 3.4–10.8)

## 2015-06-16 LAB — PROTEIN ELECTROPHORESIS, SERUM
A/G Ratio: 1.2 (ref 0.7–1.7)
ALBUMIN ELP: 3.2 g/dL (ref 2.9–4.4)
ALPHA 1: 0.2 g/dL (ref 0.0–0.4)
ALPHA 2: 0.7 g/dL (ref 0.4–1.0)
Beta: 1 g/dL (ref 0.7–1.3)
Gamma Globulin: 0.9 g/dL (ref 0.4–1.8)
Globulin, Total: 2.7 g/dL (ref 2.2–3.9)

## 2015-06-16 LAB — TSH: TSH: 3.68 u[IU]/mL (ref 0.450–4.500)

## 2015-06-16 LAB — SEDIMENTATION RATE: SED RATE: 4 mm/h (ref 0–40)

## 2015-06-20 ENCOUNTER — Telehealth: Payer: Self-pay

## 2015-06-20 MED ORDER — POTASSIUM CHLORIDE ER 10 MEQ PO TBCR: 10.0000 meq | EXTENDED_RELEASE_TABLET | Freq: Every day | ORAL | Status: AC

## 2015-06-20 NOTE — Telephone Encounter (Signed)
Advised patient as below. Medication sent into pharmacy.  

## 2015-06-20 NOTE — Telephone Encounter (Signed)
-----   Message from Maple Hudsonichard L Gilbert Jr., MD sent at 06/19/2015  6:19 AM EDT ----- Labs okay other than mildly low potassium. Start KCl 10 mEq daily

## 2015-07-19 ENCOUNTER — Ambulatory Visit (INDEPENDENT_AMBULATORY_CARE_PROVIDER_SITE_OTHER): Payer: BLUE CROSS/BLUE SHIELD | Admitting: Family Medicine

## 2015-07-19 VITALS — BP 108/82 | HR 80 | Temp 98.3°F | Resp 14 | Wt 159.0 lb

## 2015-07-19 DIAGNOSIS — K219 Gastro-esophageal reflux disease without esophagitis: Secondary | ICD-10-CM

## 2015-07-19 DIAGNOSIS — G3109 Other frontotemporal dementia: Secondary | ICD-10-CM | POA: Diagnosis not present

## 2015-07-19 DIAGNOSIS — G4700 Insomnia, unspecified: Secondary | ICD-10-CM | POA: Diagnosis not present

## 2015-07-19 DIAGNOSIS — K589 Irritable bowel syndrome without diarrhea: Secondary | ICD-10-CM

## 2015-07-19 DIAGNOSIS — F419 Anxiety disorder, unspecified: Secondary | ICD-10-CM

## 2015-07-19 DIAGNOSIS — J302 Other seasonal allergic rhinitis: Secondary | ICD-10-CM | POA: Diagnosis not present

## 2015-07-19 DIAGNOSIS — F0280 Dementia in other diseases classified elsewhere without behavioral disturbance: Secondary | ICD-10-CM

## 2015-07-19 MED ORDER — ALPRAZOLAM 0.5 MG PO TABS: 0.5000 mg | ORAL_TABLET | Freq: Two times a day (BID) | ORAL | Status: AC | PRN

## 2015-07-19 MED ORDER — MONTELUKAST SODIUM 10 MG PO TABS: 10.0000 mg | ORAL_TABLET | Freq: Every day | ORAL | Status: AC

## 2015-07-19 MED ORDER — POLYETHYLENE GLYCOL 3350 17 GM/SCOOP PO POWD: 17.0000 g | Freq: Two times a day (BID) | ORAL | Status: AC | PRN

## 2015-07-19 MED ORDER — ALPRAZOLAM 0.5 MG PO TABS: 0.5000 mg | ORAL_TABLET | Freq: Every evening | ORAL | Status: DC | PRN

## 2015-07-19 MED ORDER — TRAZODONE HCL 150 MG PO TABS: ORAL_TABLET | ORAL | Status: AC

## 2015-07-19 NOTE — Progress Notes (Signed)
Patient ID: Katie Werner, female   DOB: 09-07-1955, 60 y.o.   MRN: [id number]    Subjective:  HPI  Patient is here for 1 month follow up.  GERD/IBS: patient was instructed to take Omeprazole 1 tablet in the morning and Hyoscamine 1 tablet twice daily. Patient states stomach pain is still there and she is still having a lot of bowels after eating. Her swallowing problem, throat issue seemed to improve husband states patient has not complained about it. Patient does not eat much and does not eat well per husband, patient thinks that her stool is backed up and that is why she keeps going to the bathroom.  Allergic Rhinitis: patient is still taking Loratadine and it was mentioned in the last note that Singulair may need to be added. She is still having drainage.  Insomnia: Trazodone and Xanax is needing to be refilled. Husband states patient is still not sleeping well, has issue falling and staying asleep, he wanted to see if she can increase Trazodone dose or Xanax or what would be a good plan for this.  Husband with pt as her frontal lobe dementia is progressing. She is not driving and she says she has become a very picky eater this year.  Prior to Admission medications   Medication Sig Start Date End Date Taking? Authorizing Provider  albuterol (PROVENTIL HFA;VENTOLIN HFA) 108 (90 Base) MCG/ACT inhaler Two puffs every 4-6 hours for wheezing or shortness of breath 06/01/15  Yes Kyliegh Jester Hulen ShoutsL  Jr., MD  ALPRAZolam Prudy Feeler(XANAX) 0.5 MG tablet Take 1 tablet (0.5 mg total) by mouth at bedtime as needed for anxiety. 02/18/15  Yes Maylie Ashton Hulen ShoutsL  Jr., MD  azelastine (ASTELIN) 0.1 % nasal spray instill 2 sprays into each nostril every 12 hours if needed for ALLERGIES 03/04/15  Yes Historical Provider, MD  donepezil (ARICEPT) 5 MG tablet Take 5 mg by mouth at bedtime.   Yes Historical Provider, MD  DULoxetine (CYMBALTA) 30 MG capsule Take 1 capsule (30 mg total) by mouth daily. 05/05/15  Yes Dredyn Gubbels Hulen ShoutsL   Jr., MD  fluticasone [facility](FLONASE) 50 MCG/ACT nasal spray Place 2 sprays into both nostrils daily. 07/27/14  Yes Anola Gurneyobert Chauvin, PA  Fluticasone-Salmeterol (ADVAIR) 250-50 MCG/DOSE AEPB Inhale 1 puff into the lungs 2 (two) times daily. 06/01/15  Yes Yazmine Sorey Hulen ShoutsL  Jr., MD  hyoscyamine (LEVSIN, ANASPAZ) 0.125 MG tablet Take 1 tablet (0.125 mg total) by mouth 2 (two) times daily. 06/14/15  Yes Leiana Rund Hulen ShoutsL  Jr., MD  levothyroxine (SYNTHROID, LEVOTHROID) 25 MCG tablet Take 1 tablet (25 mcg total) by mouth daily before breakfast. 05/05/15  Yes Maple Hudsonichard L  Jr., MD  loratadine (CLARITIN) 10 MG tablet Take 1 tablet (10 mg total) by mouth daily. 04/14/15  Yes Shamika Pedregon Hulen ShoutsL  Jr., MD  losartan (COZAAR) 50 MG tablet Take 1 tablet (50 mg total) by mouth daily. 06/14/15  Yes Hertha Gergen Hulen ShoutsL  Jr., MD  omeprazole (PRILOSEC) 20 MG capsule Take 1 capsule (20 mg total) by mouth daily. 04/14/15  Yes Raiford Fetterman Hulen ShoutsL  Jr., MD  potassium chloride (K-DUR) 10 MEQ tablet Take 1 tablet (10 mEq total) by mouth daily. 06/20/15  Yes Novalyn Lajara Hulen ShoutsL  Jr., MD  traZODone (DESYREL) 100 MG tablet Take 1 tablet (100 mg total) by mouth at bedtime. 02/24/15  Yes Danijah Noh Hulen ShoutsL  Jr., MD  venlafaxine (EFFEXOR) 75 MG tablet Take 75 mg by mouth daily. 3 tablets daily   Yes Historical Provider, MD    Patient Active Problem List  Diagnosis Date Noted  . Anxiety 07/27/2014  . 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 Acute coryza 07/27/2014  . AB (asthmatic bronchitis) 07/27/2014  . Gout 07/27/2014  . Acid reflux 07/27/2014  . Arthralgia of hip 07/27/2014  . History of asthma 07/27/2014  . History of digestive disease 07/27/2014  . BP (high blood pressure) 07/27/2014  . Avitaminosis D 07/27/2014    Past Medical History  Diagnosis Date  . Depression   . Hypertension   . Gout   . Arthritis   . GERD (gastroesophageal reflux disease)   . Asthma   . Gout   . Constipation     Social History   Social History  . Marital  Status: Married    Spouse Name: N/A  . Number of Children: N/A  . Years of Education: N/A   Occupational History  . Not on file.   Social History Main Topics  . Smoking status: Current Every Day Smoker -- 0.25 packs/day for 35 years    Types: Cigarettes  . Smokeless tobacco: Never Used  . Alcohol Use: No  . Drug Use: No  . Sexual Activity: Not Currently   Other Topics Concern  . Not on file   Social History Narrative    Allergies  Allergen Reactions  . Penicillins     breaks out and has trouble breathing    Review of Systems  Constitutional: Positive for malaise/fatigue.  HENT: Positive for congestion.   Respiratory: Negative.   Cardiovascular: Negative.   Gastrointestinal: Positive for abdominal pain and diarrhea.  Musculoskeletal: Positive for myalgias, back pain and joint pain.  Neurological: Positive for weakness.  Psychiatric/Behavioral: Positive for memory loss. The patient has insomnia.     Immunization History  Administered Date(s) Administered  . Influenza,inj,Quad PF,36+ Mos 11/11/2014  . Pneumococcal Polysaccharide-23 01/16/2011  . Tdap 01/15/2013   Objective:  BP 108/82 mmHg  Pulse 80  Temp(Src) 98.3 F (36.8 C)  Resp 14  Wt 159 lb (72.122 kg)  LMP  (LMP Unknown)  Physical Exam  Constitutional: She is oriented to person, place, and time. Vital signs are normal.  HENT:  Head: Normocephalic and atraumatic.  Eyes: Conjunctivae are normal. Pupils are equal, round, and reactive to light.  Neck: Normal range of motion. Neck supple.  Cardiovascular: Normal rate, regular rhythm, normal heart sounds and intact distal pulses.   No murmur heard. Pulmonary/Chest: Effort normal and breath sounds normal. No respiratory distress. She has no wheezes.  Abdominal: There is tenderness.  Musculoskeletal: She exhibits no edema or tenderness.  Neurological: She is alert and oriented to person, place, and time.    Lab Results  Component Value Date   WBC 6.6  06/14/2015   HGB 14.6 05/15/2013   HCT 39.6 06/14/2015   PLT 367 06/14/2015   GLUCOSE 112* 06/14/2015   CHOL 234* 01/25/2015   TRIG 133 01/25/2015   HDL 50 01/25/2015   LDLCALC 157* 01/25/2015   TSH 3.680 06/14/2015    CMP     Component Value Date/Time   NA 142 06/14/2015 0845   NA 137 05/15/2013 0047   K 3.2* 06/14/2015 0845   K 3.7 05/15/2013 0047   CL 93* 06/14/2015 0845   CL 102 05/15/2013 0047   CO2 30* 06/14/2015 0845   CO2 30 05/15/2013 0047   GLUCOSE 112* 06/14/2015 0845   GLUCOSE 99 05/15/2013 0047   BUN 4* 06/14/2015 0845   BUN 15 05/15/2013 0047   CREATININE 1.07* 06/14/2015 0845   CREATININE 1.21 05/15/2013  0047   CALCIUM 9.2 06/14/2015 0845   CALCIUM 8.6 05/15/2013 0047   PROT 5.9* 06/14/2015 0845   PROT 7.0 05/15/2013 0047   ALBUMIN 3.9 06/14/2015 0845   ALBUMIN 3.7 05/15/2013 0047   AST 13 06/14/2015 0845   AST 13* 05/15/2013 0047   ALT 13 06/14/2015 0845   ALT 27 05/15/2013 0047   ALKPHOS 85 06/14/2015 0845   ALKPHOS 102 05/15/2013 0047   BILITOT 0.4 06/14/2015 0845   BILITOT 0.4 05/15/2013 0047   GFRNONAA 58* 06/14/2015 0845   GFRNONAA 50* 05/15/2013 0047   GFRAA 67 06/14/2015 0845   GFRAA 58* 05/15/2013 0047    Assessment and Plan :  1. Gastroesophageal reflux disease, esophagitis presence not specified Stable continue current medication.  2. IBS (irritable bowel syndrome) Things she eats and how much she eats and anxiety issues I think contribute to this. "She is a picky eater, per husband"  3. Seasonal allergies Add Singulair and continue Loratadine and Flonase use.  4. Insomnia Worsening. Increase Trazodone to 150 mg, this is safer option for the patient rather than increasing Xanax dose. - traZODone (DESYREL) 150 MG tablet; Take 1 tablet at bedtime  Dispense: 30 tablet; Refill: 12  5. Frontal lobe dementia Following Dr. Sherryll Burger  6. Anxiety Refill provided for Xanax after discussion with patient's husband and changed directions to 1  BID. Patient seem to be more anxious than she use to.May need more xanax over time as cognition declines. More than 50% of visit spent in counselling for pt and husband. Patient was seen and examined by Dr. Bosie Clos and note was scribed by Samara Deist, RMA.    Julieanne Manson MD [facility] 07/19/2015 11:23 AM

## 2015-07-27 ENCOUNTER — Other Ambulatory Visit: Payer: Self-pay | Admitting: Neurology

## 2015-07-27 DIAGNOSIS — R29810 Facial weakness: Secondary | ICD-10-CM

## 2015-07-27 DIAGNOSIS — R413 Other amnesia: Secondary | ICD-10-CM

## 2015-07-27 DIAGNOSIS — R4781 Slurred speech: Secondary | ICD-10-CM

## 2015-08-18 ENCOUNTER — Ambulatory Visit
Admission: RE | Admit: 2015-08-18 | Discharge: 2015-08-18 | Disposition: A | Discharge status: Home / Self Care | Payer: BLUE CROSS/BLUE SHIELD | Source: Ambulatory Visit | Attending: Neurology | Admitting: Neurology

## 2015-08-18 DIAGNOSIS — R29810 Facial weakness: Secondary | ICD-10-CM | POA: Insufficient documentation

## 2015-08-18 DIAGNOSIS — R413 Other amnesia: Secondary | ICD-10-CM | POA: Diagnosis present

## 2015-08-18 DIAGNOSIS — R4781 Slurred speech: Secondary | ICD-10-CM | POA: Insufficient documentation

## 2015-08-18 DIAGNOSIS — I639 Cerebral infarction, unspecified: Secondary | ICD-10-CM | POA: Insufficient documentation

## 2015-09-22 ENCOUNTER — Encounter: Payer: Self-pay | Admitting: Family Medicine

## 2015-09-22 ENCOUNTER — Ambulatory Visit (INDEPENDENT_AMBULATORY_CARE_PROVIDER_SITE_OTHER): Payer: BLUE CROSS/BLUE SHIELD | Admitting: Family Medicine

## 2015-09-22 VITALS — BP 122/60 | HR 72 | Temp 97.9°F | Resp 16 | Wt 147.0 lb

## 2015-09-22 DIAGNOSIS — R197 Diarrhea, unspecified: Secondary | ICD-10-CM | POA: Diagnosis not present

## 2015-09-22 MED ORDER — HYOSCYAMINE SULFATE ER 0.375 MG PO TB12: 0.3750 mg | ORAL_TABLET | Freq: Two times a day (BID) | ORAL | 0 refills | Status: AC

## 2015-09-22 NOTE — Progress Notes (Signed)
Patient: Katie Werner Female    DOB: April 27, 1955   60 y.o.   MRN: [id number] Visit Date: 09/22/2015  Today's Provider: Megan Mans, MD   Chief Complaint  Patient presents with  . Insomnia    1 month follow up    Subjective:    HPI  Patient comes in today for a 1 month follow up on Insomnia. Patient was recommended to increase Trazodone to  at bedtime. Patient's husband reports that this has improved her sleeping. She has been sleeping at least 8 hours a night.     Patient was also started on Singulair, and reports that this has also helped with her allergies.   Allergies  Allergen Reactions  . Penicillins     breaks out and has trouble breathing   Current Meds  Medication Sig  . albuterol (PROVENTIL HFA;VENTOLIN HFA) 108 (90 Base) MCG/ACT inhaler Two puffs every 4-6 hours for wheezing or shortness of breath  . ALPRAZolam (XANAX) 0.5 MG tablet Take 1 tablet (0.5 mg total) by mouth 2 (two) times daily as needed for anxiety.  Marland Kitchen azelastine (ASTELIN) 0.1 % nasal spray instill 2 sprays into each nostril every 12 hours if needed for ALLERGIES  . donepezil (ARICEPT) 5 MG tablet Take 5 mg by mouth at bedtime.  . DULoxetine (CYMBALTA) 30 MG capsule Take 1 capsule (30 mg total) by mouth daily.  . fluticasone (FLONASE) 50 MCG/ACT nasal spray Place 2 sprays into both nostrils daily.  . Fluticasone-Salmeterol (ADVAIR) 250-50 MCG/DOSE AEPB Inhale 1 puff into the lungs 2 (two) times daily.  . hyoscyamine (LEVSIN, ANASPAZ) 0.125 MG tablet Take 1 tablet (0.125 mg total) by mouth 2 (two) times daily.  Marland Kitchen levothyroxine (SYNTHROID, LEVOTHROID) 25 MCG tablet Take 1 tablet (25 mcg total) by mouth daily before breakfast.  . loratadine (CLARITIN) 10 MG tablet Take 1 tablet (10 mg total) by mouth daily.  Marland Kitchen losartan (COZAAR) 50 MG tablet Take 1 tablet (50 mg total) by mouth daily.  . montelukast (SINGULAIR) 10 MG tablet Take 1 tablet (10 mg total) by mouth at bedtime.  Marland Kitchen omeprazole  (PRILOSEC) 20 MG capsule Take 1 capsule (20 mg total) by mouth daily.  . polyethylene glycol powder (GLYCOLAX/MIRALAX) powder Take 17 g by mouth 2 (two) times daily as needed.  . potassium chloride (K-DUR) 10 MEQ tablet Take 1 tablet (10 mEq total) by mouth daily.  . traZODone (DESYREL) 150 MG tablet Take 1 tablet at bedtime  . venlafaxine (EFFEXOR) 75 MG tablet Take 75 mg by mouth daily. 3 tablets daily    Review of Systems  Constitutional: Negative.   Respiratory: Negative.   Cardiovascular: Negative.   Neurological: Negative.        Progressive memory loss and dementia.    Social History  Substance Use Topics  . Smoking status: Current Every Day Smoker    Packs/day: 0.25    Years: 35.00    Types: Cigarettes  . Smokeless tobacco: Never Used  . Alcohol use No   Objective:   BP 122/60 (BP Location: Right Arm, Patient Position: Sitting, Cuff Size: Normal)   Pulse 72   Temp 97.9 F (36.6 C)   Resp 16   Wt 147 lb (66.7 kg)   LMP  (LMP Unknown)   BMI 27.55 kg/m   Physical Exam  Constitutional: She appears well-developed and well-nourished.  HENT:  Head: Normocephalic and atraumatic.  Right Ear: External ear normal.  Left Ear: External ear normal.  Nose: Nose normal.  Eyes: Conjunctivae are normal.  Neck: Neck supple. No thyromegaly present.  Cardiovascular: Normal rate, regular rhythm and normal heart sounds.   Pulmonary/Chest: Effort normal and breath sounds normal.  Abdominal: Soft. Bowel sounds are normal.  Lymphadenopathy:    She has no cervical adenopathy.  Skin: Skin is warm and dry.  Psychiatric: She has a normal mood and affect.        Assessment & Plan:     1. Diarrhea, unspecified type/IBS  - hyoscyamine (LEVBID) 0.375 MG 12 hr tablet; Take 1 tablet (0.375 mg total) by mouth 2 (two) times daily.  Dispense: 60 tablet; Refill: 0 2. 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 Prognosis is poor. Husband  is aware.      Richard Wendelyn BreslowGilbert Jr, MD  [facility]

## 2015-12-14 DEATH — deceased

## 2017-09-19 IMAGING — MR MR HEAD W/O CM
10 of 11 series · 41 of 48 positions shown · non-contrast
Comparison: CT head 05/15/2013

CLINICAL DATA: Mild cognitive impairment.  Memory loss.

EXAM:
MRI HEAD WITHOUT CONTRAST
TECHNIQUE: Multiplanar, multiecho pulse sequences of the brain and surrounding
structures were obtained without intravenous contrast.

[Series 2: T1 · sagittal · 5.0mm · 0.45mm/px · 5 of 29 slices shown]
[im 1/29]
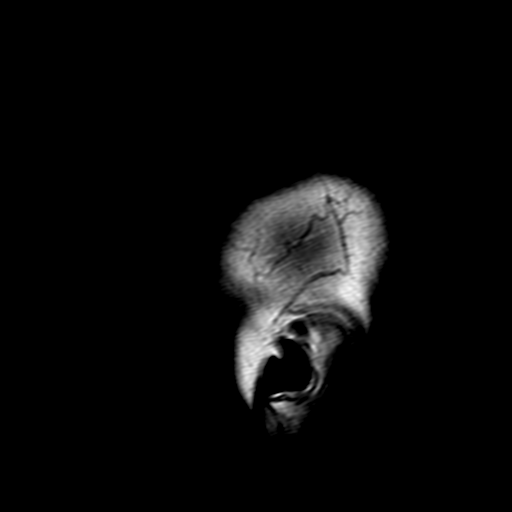
[im 8/29]
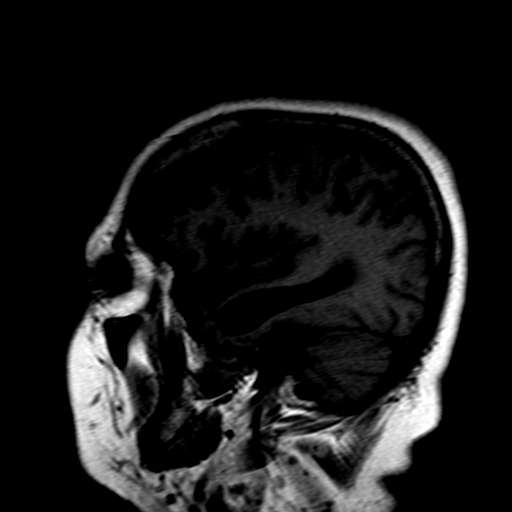
[im 15/29]
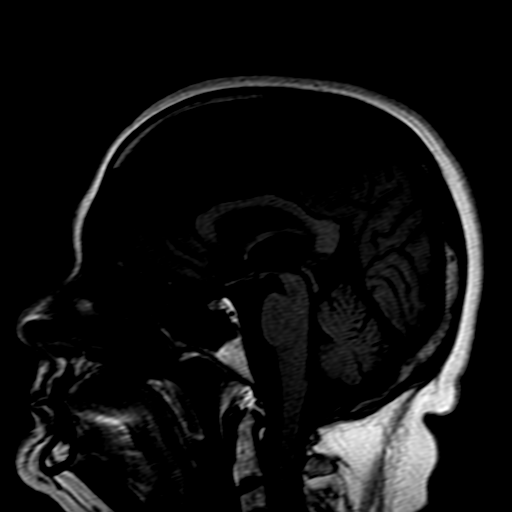
[im 22/29]
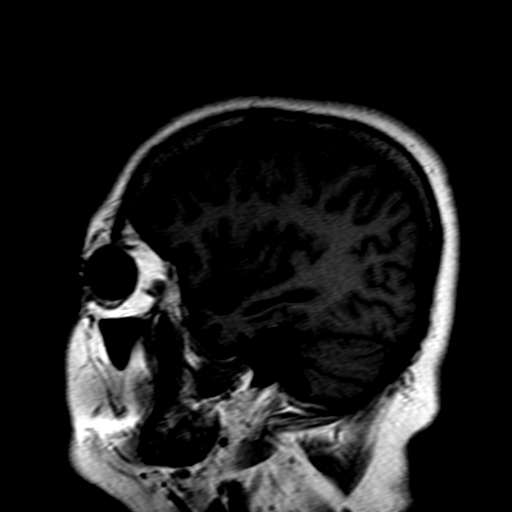
[im 29/29]
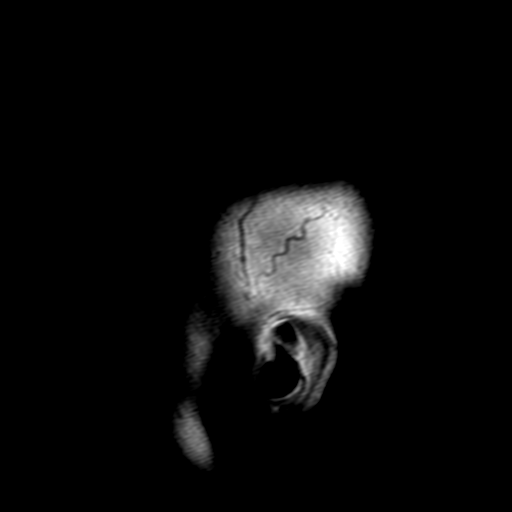

[Series 4: DWI · axial · 4.0mm · 0.94mm/px · z∈[-69,+101]mm · 5 of 44 slices shown (1 of 4)]
[im 1/44]
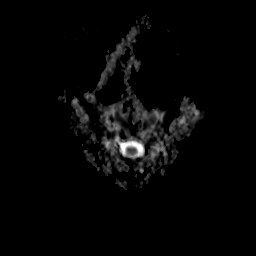
[im 11/44]
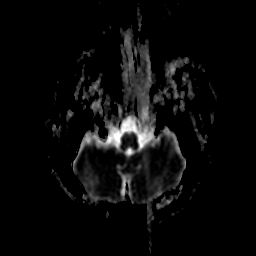
[im 22/44]
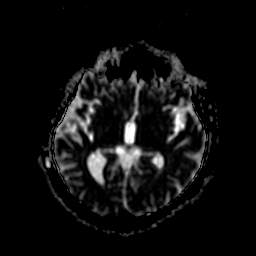
[im 33/44]
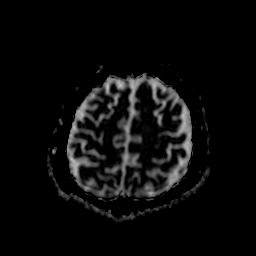
[im 44/44]
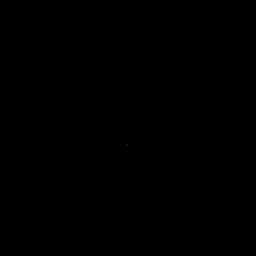

[Series 5: DWI · axial · 4.0mm · 0.94mm/px · z∈[-69,+93]mm · 5 of 41 slices shown (2 of 4)]
[im 1/41]
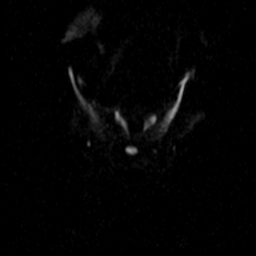
[im 11/41]
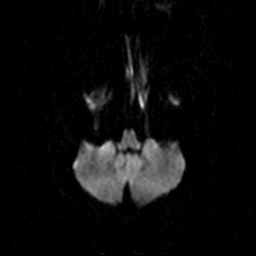
[im 21/41]
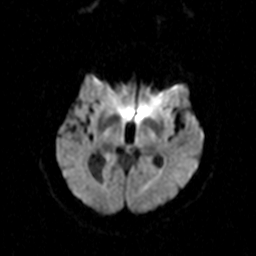
[im 31/41]
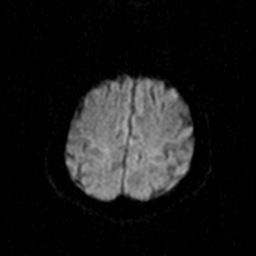
[im 41/41]
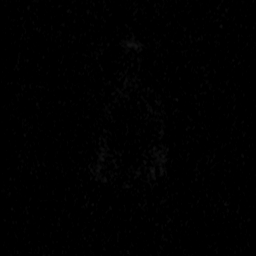

[Series 7: DWI · coronal · 5.0mm · 1.80mm/px · 5 of 39 slices shown (3 of 4)]
[im 1/39]
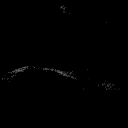
[im 10/39]
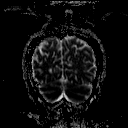
[im 20/39]
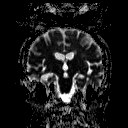
[im 29/39]
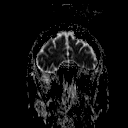
[im 39/39]
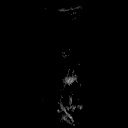

[Series 8: DWI · coronal · 5.0mm · 1.80mm/px · 4 of 35 slices shown (4 of 4)]
[im 1/35]
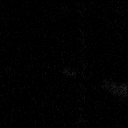
[im 12/35]
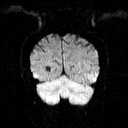
[im 23/35]
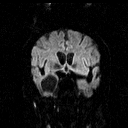
[im 35/35]
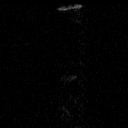

[Series 10: FLAIR · axial · 5.0mm · 0.90mm/px · z∈[-67,+100]mm · 3 of 27 slices shown (1 of 2)]
[im 1/27]
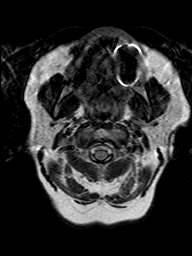
[im 14/27]
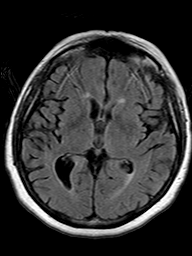
[im 27/27]
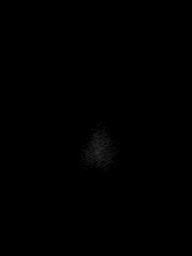

[Series 11: T2 · axial · 5.0mm · 0.45mm/px · z∈[-67,+100]mm · 3 of 27 slices shown (1 of 3)]
[im 1/27]
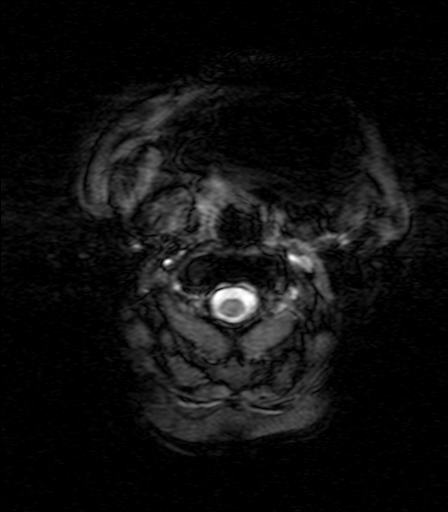
[im 14/27]
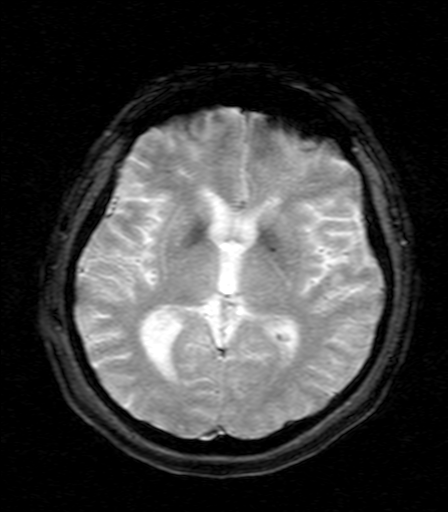
[im 27/27]
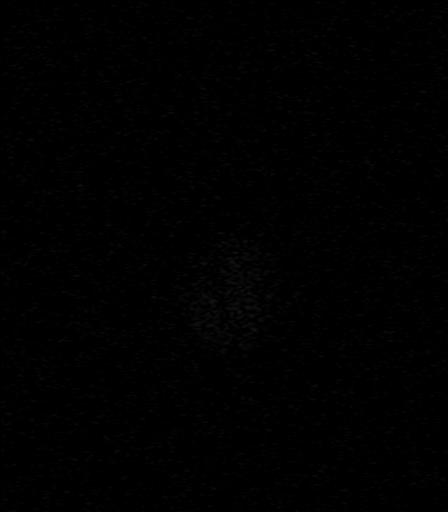

[Series 13: T2 · coronal · 5.0mm · 0.45mm/px · 4 of 29 slices shown (2 of 3)]
[im 1/29]
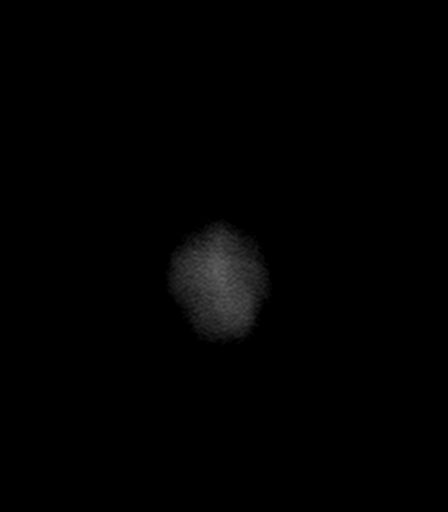
[im 10/29]
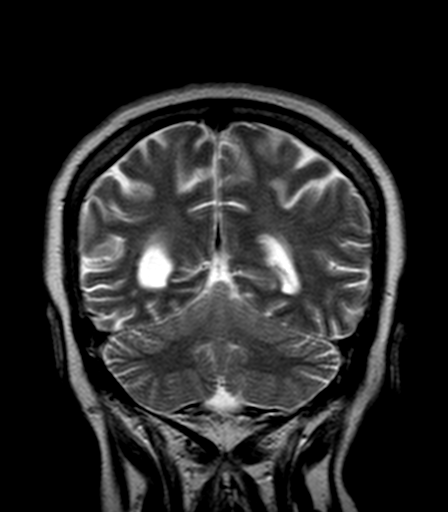
[im 19/29]
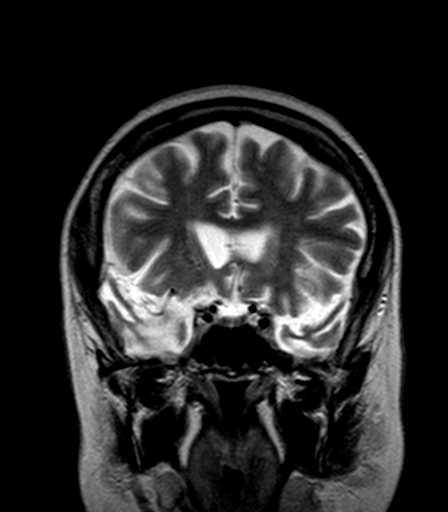
[im 29/29]
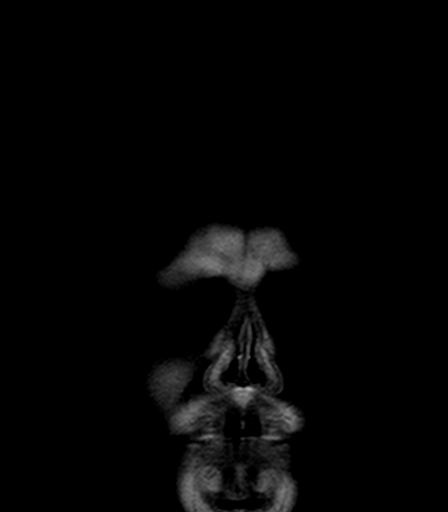

[Series 14: T2 · axial · 5.0mm · 0.45mm/px · z∈[-67,+100]mm · 3 of 27 slices shown (3 of 3)]
[im 1/27]
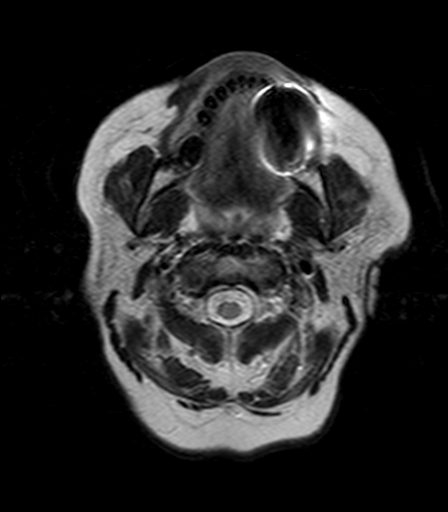
[im 14/27]
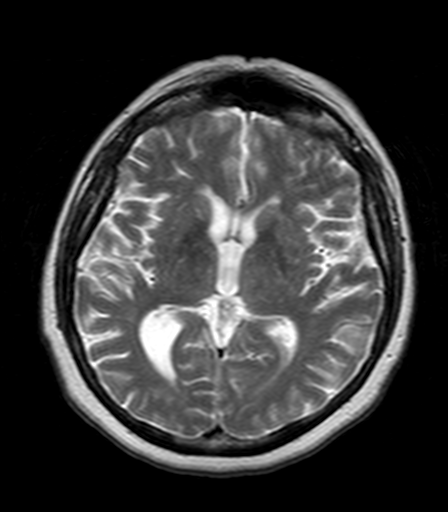
[im 27/27]
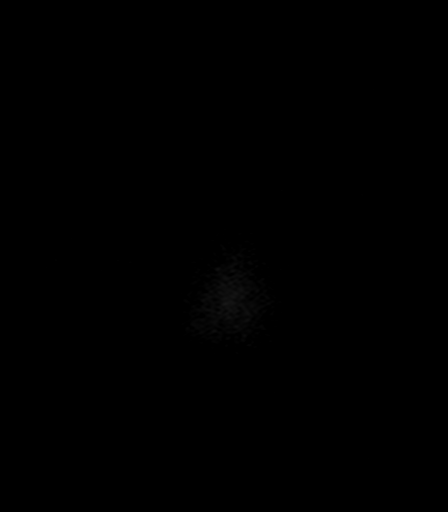

[Series 15: FLAIR · sagittal · 5.0mm · 0.90mm/px · 4 of 29 slices shown (2 of 2)]
[im 1/29]
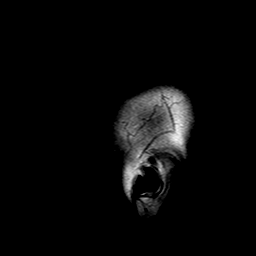
[im 10/29]
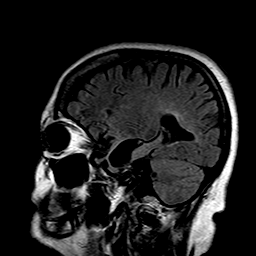
[im 19/29]
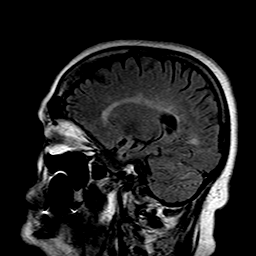
[im 29/29]
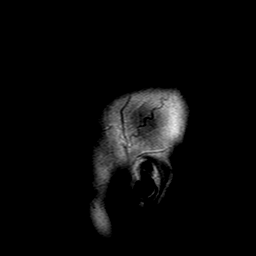

[41 of 48 positions shown; findings below may reference images not displayed]

FINDINGS: Marked temporal lobe volume loss right greater than left has
progressed in the interval. There is marked enlargement of the right
temporal horn and volume loss of the right anterior medial temporal
lobe. Moderate volume loss of the left temporal lobe has progressed
as well.

Negative for hydrocephalus. Frontal and parietal volume normal for
age.

Negative for acute infarct. Hyperintensity in the periventricular
white matter and deep white matter bilaterally compatible with mild
chronic microvascular ischemia.

Negative for hemorrhage.  Negative for mass or edema.

Circle Willis patent.  Paranasal sinuses clear.
IMPRESSION: Marked temporal lobe volume loss, asymmetric on the right. There is
progression of temporal lobe volume loss bilaterally since 4408.
This pattern can be seen with Alzheimer's disease. Correlate with
any prior history of infarct, trauma, or encephalitis.
